# Patient Record
Sex: Male | Born: 1975 | Race: White | Hispanic: No | Marital: Single | Smoking: Current every day smoker
Health system: Southern US, Community
[De-identification: ages and names within clinical notes are randomized; demographics above are authoritative.]

## PROBLEM LIST (undated history)

## (undated) DIAGNOSIS — M549 Dorsalgia, unspecified: Secondary | ICD-10-CM

## (undated) DIAGNOSIS — M5430 Sciatica, unspecified side: Secondary | ICD-10-CM

## (undated) DIAGNOSIS — R51 Headache: Secondary | ICD-10-CM

## (undated) DIAGNOSIS — M24419 Recurrent dislocation, unspecified shoulder: Secondary | ICD-10-CM

## (undated) DIAGNOSIS — F1910 Other psychoactive substance abuse, uncomplicated: Secondary | ICD-10-CM

## (undated) DIAGNOSIS — R569 Unspecified convulsions: Secondary | ICD-10-CM

## (undated) DIAGNOSIS — M797 Fibromyalgia: Secondary | ICD-10-CM

## (undated) DIAGNOSIS — G8929 Other chronic pain: Secondary | ICD-10-CM

## (undated) HISTORY — PX: HAND SURGERY: SHX662

## (undated) HISTORY — PX: WRIST SURGERY: SHX841

---

## 1999-06-16 ENCOUNTER — Encounter: Payer: Self-pay | Admitting: Specialist

## 1999-06-16 ENCOUNTER — Encounter: Admission: RE | Admit: 1999-06-16 | Discharge: 1999-06-16 | Payer: Self-pay | Admitting: Specialist

## 2000-05-09 ENCOUNTER — Encounter: Payer: Self-pay | Admitting: Physical Medicine & Rehabilitation

## 2000-05-09 ENCOUNTER — Ambulatory Visit (HOSPITAL_COMMUNITY)
Admission: RE | Admit: 2000-05-09 | Discharge: 2000-05-09 | Payer: Self-pay | Admitting: Physical Medicine & Rehabilitation

## 2001-11-16 ENCOUNTER — Emergency Department (HOSPITAL_COMMUNITY): Admission: EM | Admit: 2001-11-16 | Discharge: 2001-11-16 | Payer: Self-pay | Admitting: Internal Medicine

## 2002-07-19 ENCOUNTER — Encounter
Admission: RE | Admit: 2002-07-19 | Discharge: 2002-10-17 | Payer: Self-pay | Admitting: Physical Medicine & Rehabilitation

## 2005-04-01 ENCOUNTER — Emergency Department (HOSPITAL_COMMUNITY): Admission: EM | Admit: 2005-04-01 | Discharge: 2005-04-01 | Payer: Self-pay | Admitting: Emergency Medicine

## 2005-04-18 ENCOUNTER — Emergency Department (HOSPITAL_COMMUNITY): Admission: EM | Admit: 2005-04-18 | Discharge: 2005-04-18 | Payer: Self-pay | Admitting: Emergency Medicine

## 2005-05-20 ENCOUNTER — Encounter: Admission: RE | Admit: 2005-05-20 | Discharge: 2005-05-20 | Payer: Self-pay | Admitting: Neurology

## 2005-06-04 ENCOUNTER — Emergency Department (HOSPITAL_COMMUNITY): Admission: EM | Admit: 2005-06-04 | Discharge: 2005-06-04 | Payer: Self-pay | Admitting: Emergency Medicine

## 2005-07-05 ENCOUNTER — Emergency Department (HOSPITAL_COMMUNITY): Admission: EM | Admit: 2005-07-05 | Discharge: 2005-07-05 | Payer: Self-pay | Admitting: Emergency Medicine

## 2005-07-10 ENCOUNTER — Emergency Department (HOSPITAL_COMMUNITY): Admission: EM | Admit: 2005-07-10 | Discharge: 2005-07-10 | Payer: Self-pay | Admitting: Emergency Medicine

## 2006-01-27 ENCOUNTER — Emergency Department (HOSPITAL_COMMUNITY): Admission: EM | Admit: 2006-01-27 | Discharge: 2006-01-27 | Payer: Self-pay | Admitting: Emergency Medicine

## 2007-01-06 IMAGING — CR DG SHOULDER 2+V PORT*R*
2 series · 2 of 2 positions shown · non-contrast
Comparison: none

CLINICAL DATA: Anterior dislocation of the right shoulder.
 RIGHT SHOULDER ? PORTABLE ? 2 VIEWS ? 07/05/05:

[view not recorded (1 of 2)]
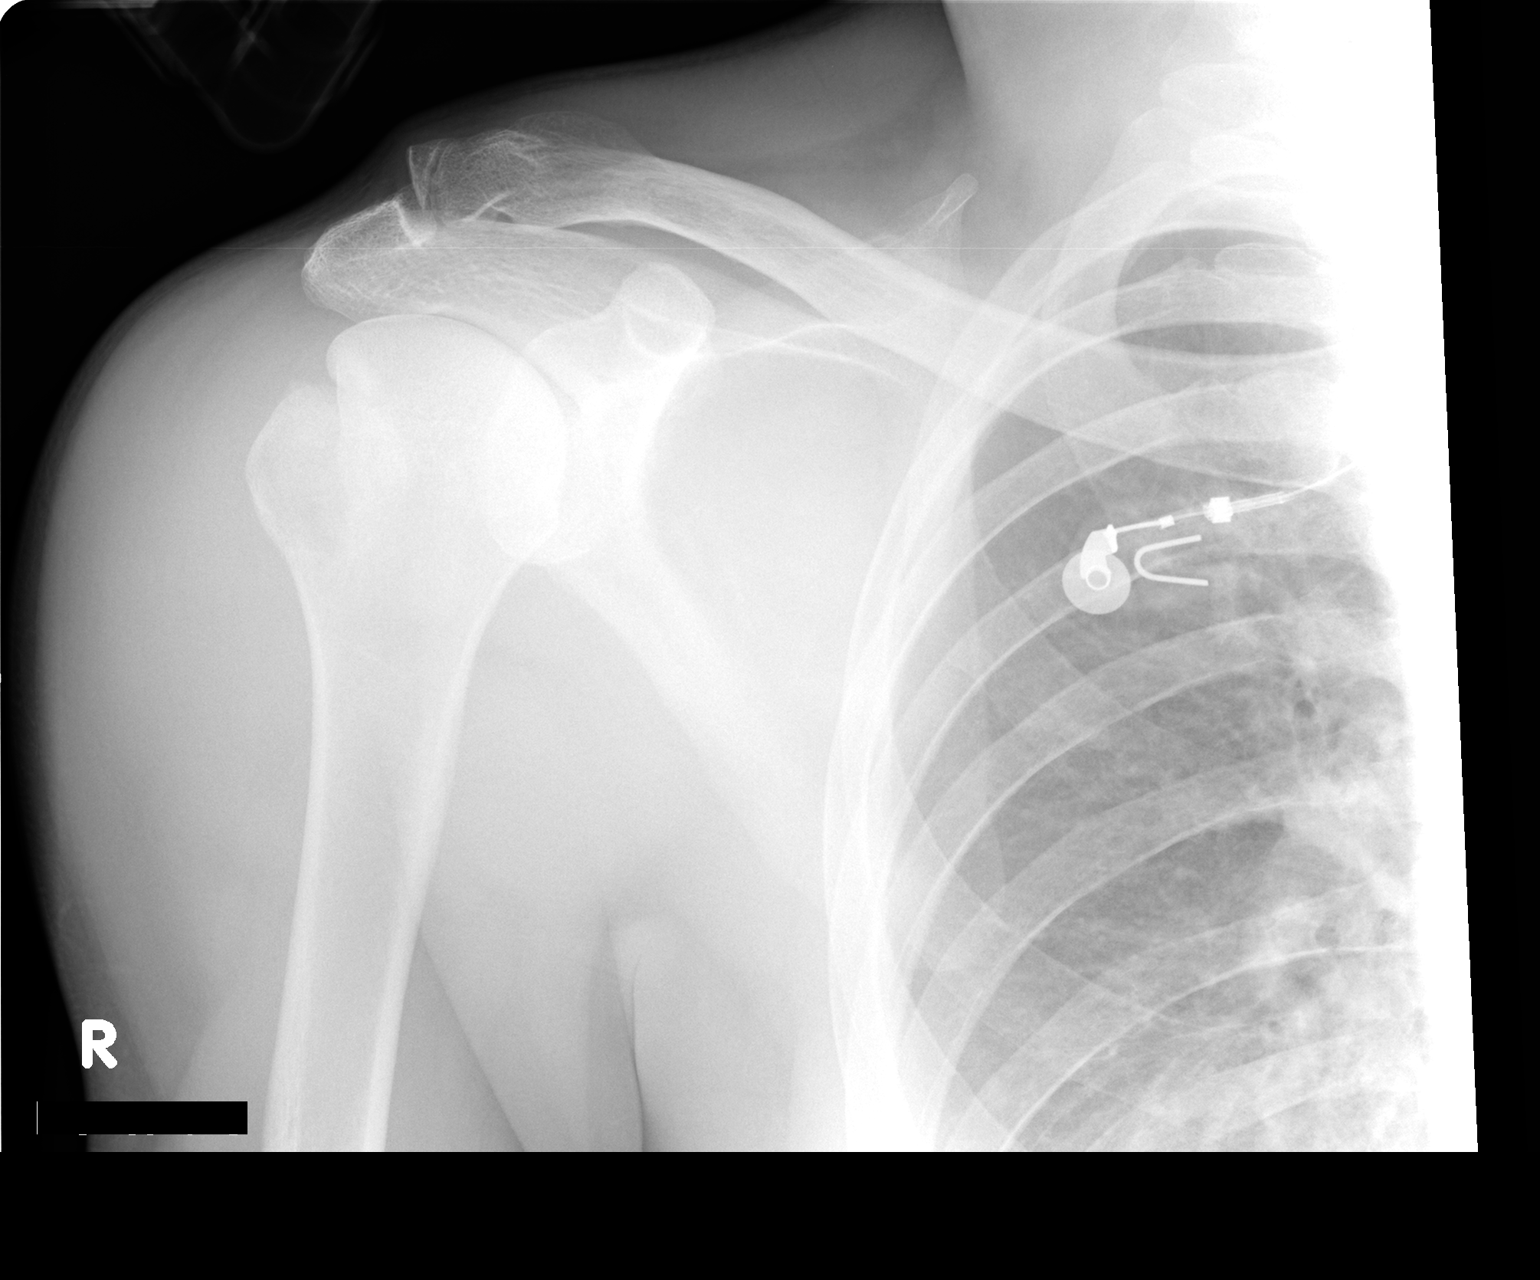

[view not recorded (2 of 2)]
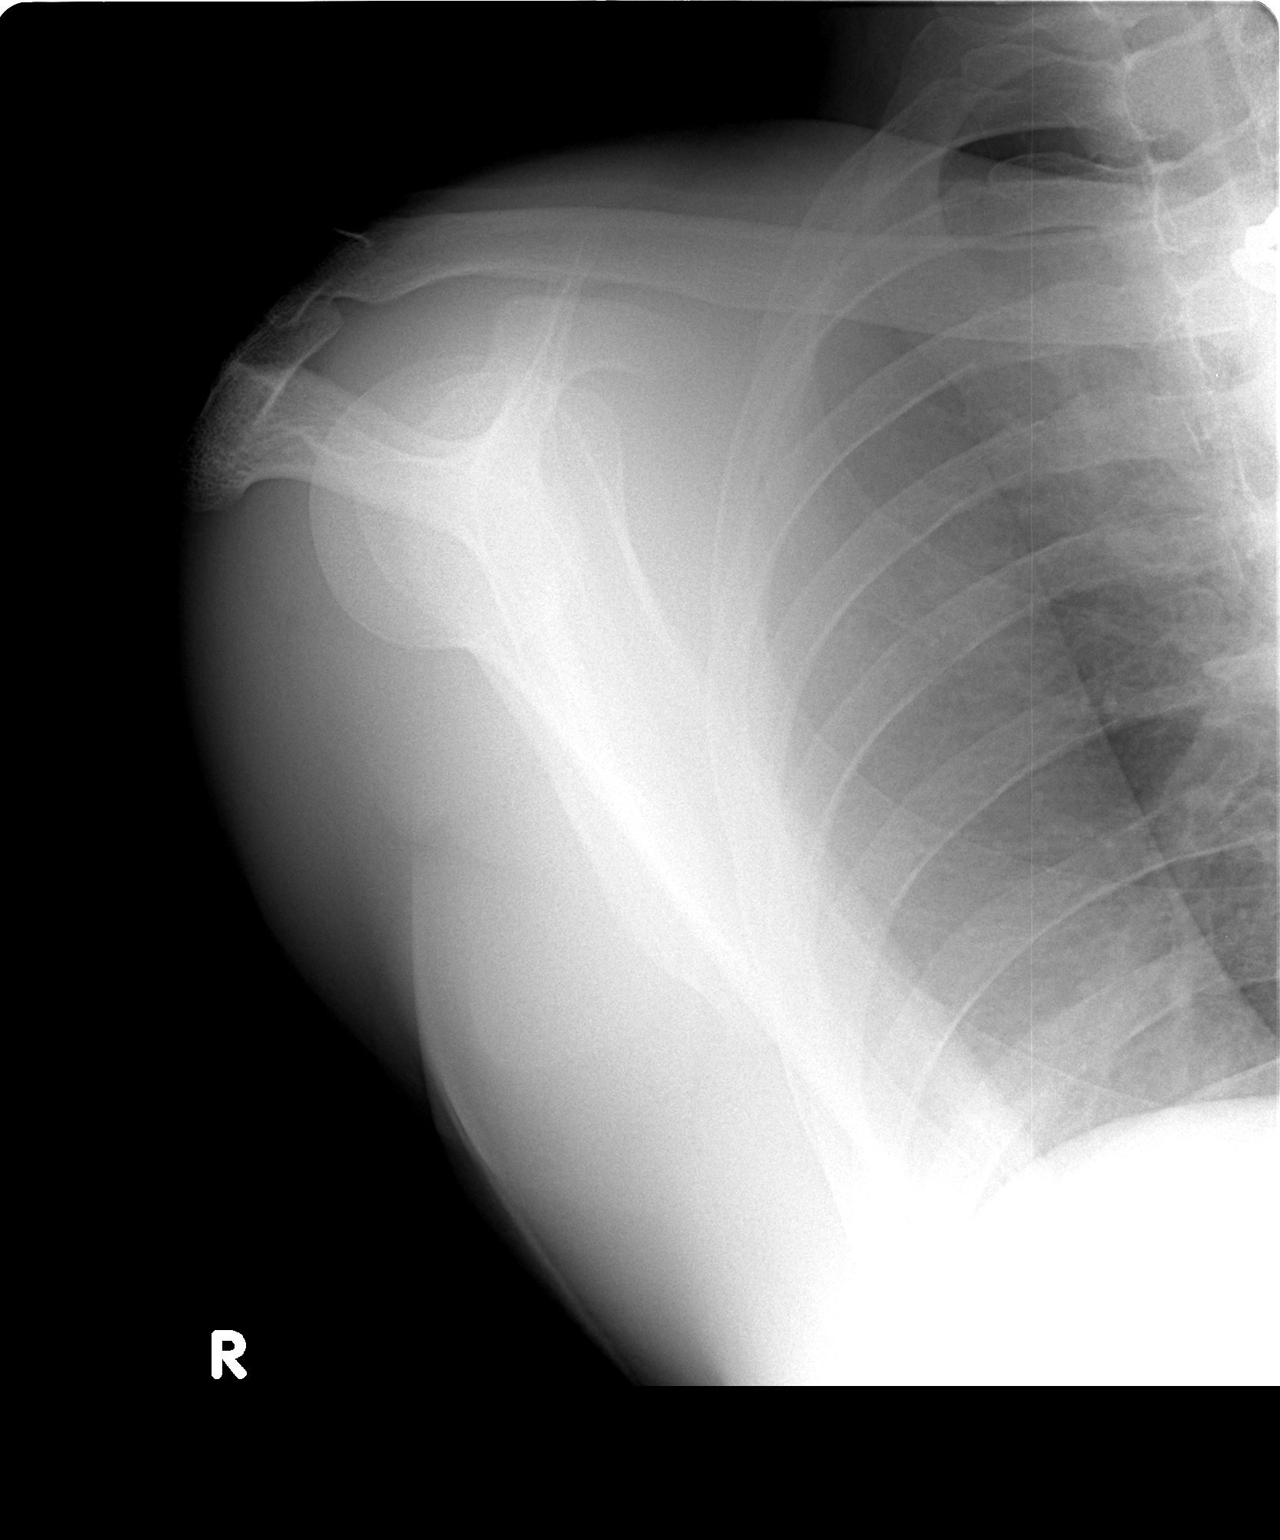

[2 of 2 positions shown; findings below may reference images not displayed]

FINDINGS: The anterior dislocation has been reduced.  The patient has a very prominent Hill-Sachs fracture of the posterolateral aspect of the humeral head.  There is evidence of an old fracture of the distal clavicle.  The transscapular view demonstrates that the dislocation has been reduced.
IMPRESSION: Reduction of the anterior dislocation.  Prominent Hill-Sachs fracture of the humeral head.

## 2007-01-11 IMAGING — CR DG SHOULDER 2+V PORT*R*
2 series · 2 of 2 positions shown · non-contrast
Comparison: none

CLINICAL DATA: Anterior fracture dislocation right shoulder.
 PORTABLE RIGHT SHOULDER ? 2 VIEW:

[view not recorded (1 of 2)]
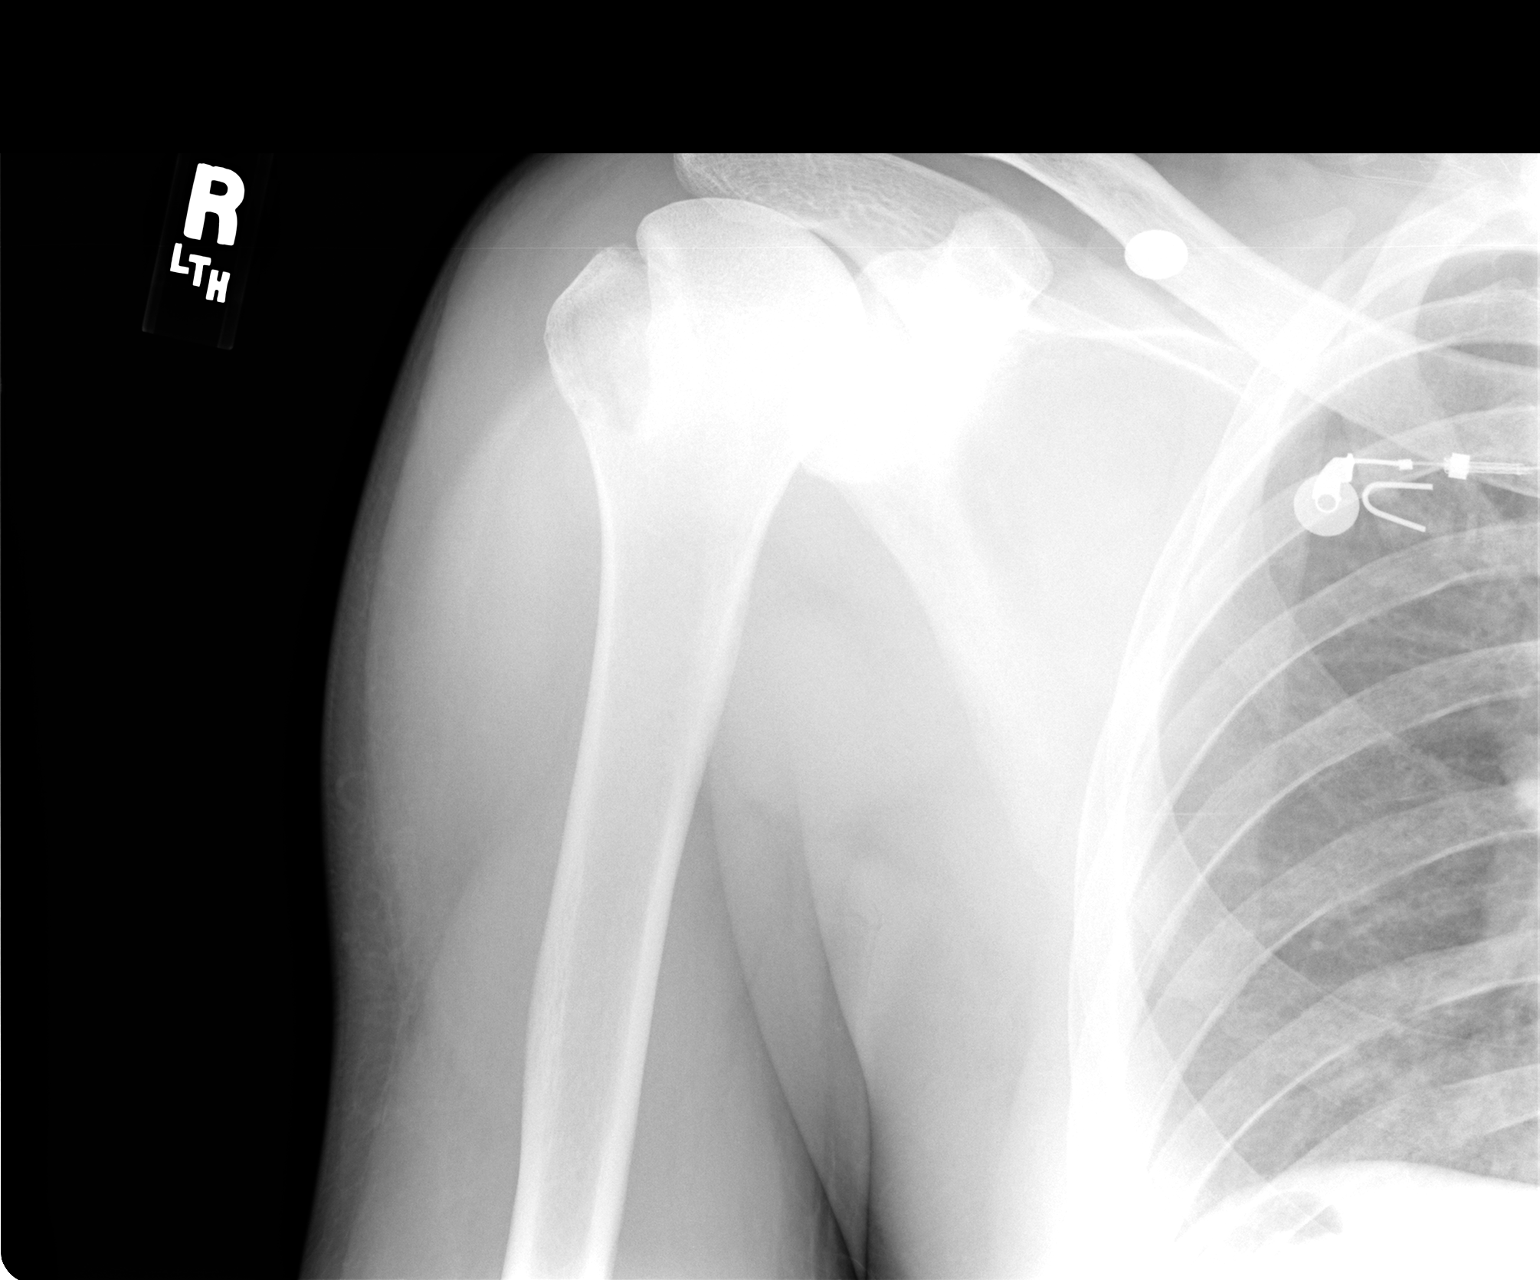

[view not recorded (2 of 2)]
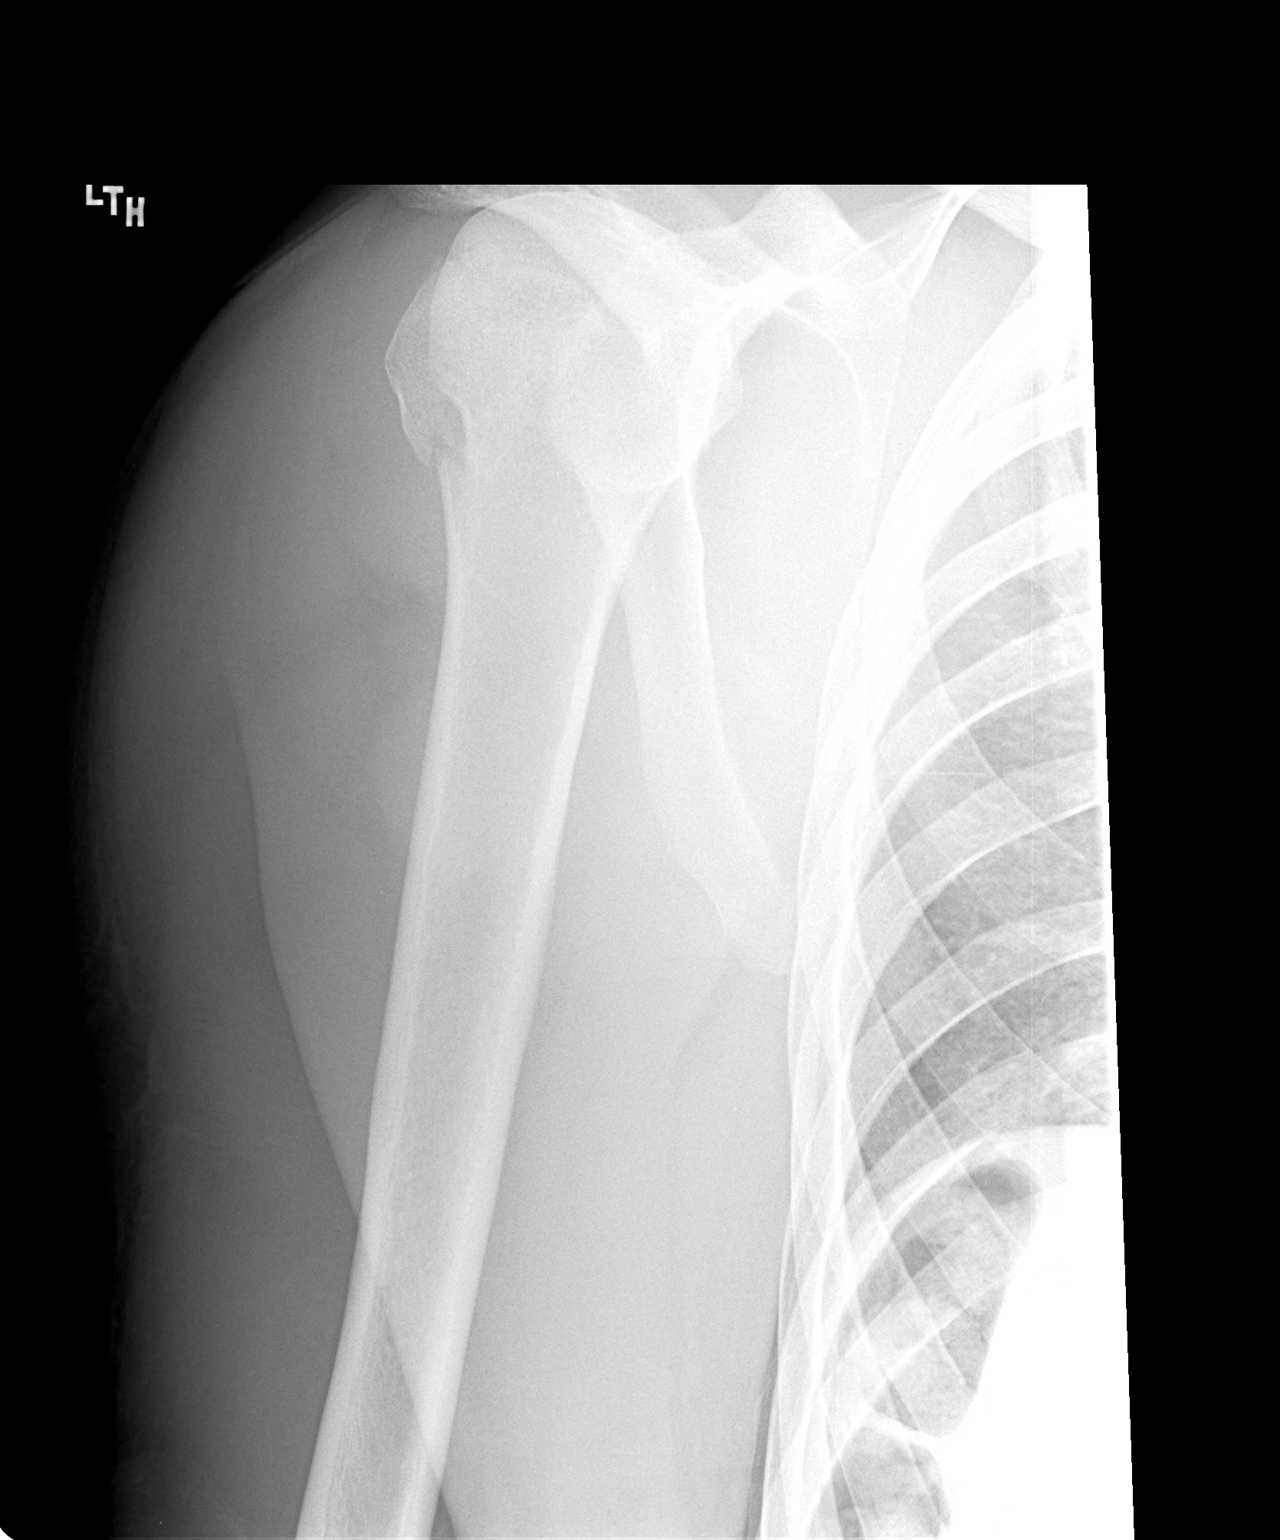

[2 of 2 positions shown; findings below may reference images not displayed]

FINDINGS: the anterior dislocation of the right shoulder has been reduced.  The humeral head is now in satisfactory position.  The fracture of the greater tuberosity and humerus is near anatomic aligned and positioned.
IMPRESSION: Satisfactory reduction anterior dislocation right shoulder as discussed above.

## 2007-02-15 ENCOUNTER — Emergency Department (HOSPITAL_COMMUNITY): Admission: EM | Admit: 2007-02-15 | Discharge: 2007-02-16 | Payer: Self-pay | Admitting: Emergency Medicine

## 2008-09-30 ENCOUNTER — Observation Stay (HOSPITAL_COMMUNITY): Admission: EM | Admit: 2008-09-30 | Discharge: 2008-09-30 | Payer: Self-pay | Admitting: Emergency Medicine

## 2009-01-21 ENCOUNTER — Emergency Department (HOSPITAL_COMMUNITY): Admission: EM | Admit: 2009-01-21 | Discharge: 2009-01-21 | Payer: Self-pay | Admitting: Emergency Medicine

## 2010-05-04 ENCOUNTER — Encounter: Payer: Self-pay | Admitting: Orthopaedic Surgery

## 2010-06-13 ENCOUNTER — Emergency Department (HOSPITAL_COMMUNITY)
Admission: EM | Admit: 2010-06-13 | Discharge: 2010-06-13 | Disposition: A | Discharge status: Home / Self Care | Payer: Self-pay | Attending: Emergency Medicine | Admitting: Emergency Medicine

## 2010-06-13 DIAGNOSIS — M549 Dorsalgia, unspecified: Secondary | ICD-10-CM | POA: Insufficient documentation

## 2010-06-13 LAB — COMPREHENSIVE METABOLIC PANEL
ALT: 28 U/L (ref 0–53)
Alkaline Phosphatase: 72 U/L (ref 39–117)
BUN: 13 mg/dL (ref 6–23)
CO2: 28 mEq/L (ref 19–32)
Chloride: 108 mEq/L (ref 96–112)

## 2010-06-13 LAB — CBC
HCT: 43.3 % (ref 39.0–52.0)
Platelets: 276 10*3/uL (ref 150–400)
RDW: 13.3 % (ref 11.5–15.5)
WBC: 10.4 10*3/uL (ref 4.0–10.5)

## 2010-06-13 LAB — DIFFERENTIAL
Basophils Relative: 0 % (ref 0–1)
Eosinophils Relative: 2 % (ref 0–5)
Lymphocytes Relative: 39 % (ref 12–46)
Lymphs Abs: 4.1 10*3/uL — ABNORMAL HIGH (ref 0.7–4.0)
Monocytes Relative: 5 % (ref 3–12)
Neutro Abs: 5.5 10*3/uL (ref 1.7–7.7)

## 2010-07-17 LAB — CARBAMAZEPINE LEVEL, TOTAL: Carbamazepine Lvl: 8.9 ug/mL (ref 4.0–12.0)

## 2010-07-21 LAB — DIFFERENTIAL
Basophils Absolute: 0 10*3/uL (ref 0.0–0.1)
Eosinophils Absolute: 0 10*3/uL (ref 0.0–0.7)
Eosinophils Relative: 0 % (ref 0–5)
Eosinophils Relative: 1 % (ref 0–5)
Lymphs Abs: 1.6 10*3/uL (ref 0.7–4.0)
Lymphs Abs: 2.3 10*3/uL (ref 0.7–4.0)
Monocytes Relative: 6 % (ref 3–12)
Neutrophils Relative %: 79 % — ABNORMAL HIGH (ref 43–77)

## 2010-07-21 LAB — COMPREHENSIVE METABOLIC PANEL
ALT: 27 U/L (ref 0–53)
AST: 25 U/L (ref 0–37)
CO2: 25 mEq/L (ref 19–32)
Calcium: 8.5 mg/dL (ref 8.4–10.5)
Chloride: 109 mEq/L (ref 96–112)
GFR calc Af Amer: >60 mL/min (ref >60)
GFR calc non Af Amer: >60 mL/min (ref >60)
Potassium: 4.4 mEq/L (ref 3.5–5.1)
Sodium: 138 mEq/L (ref 135–145)
Total Bilirubin: 0.7 mg/dL (ref 0.3–1.2)

## 2010-07-21 LAB — URINALYSIS, ROUTINE W REFLEX MICROSCOPIC
Bilirubin Urine: NEGATIVE
Ketones, ur: NEGATIVE mg/dL
Nitrite: NEGATIVE
Protein, ur: NEGATIVE mg/dL
Specific Gravity, Urine: <1.005 — ABNORMAL LOW (ref 1.005–1.030)

## 2010-07-21 LAB — BASIC METABOLIC PANEL
CO2: 27 mEq/L (ref 19–32)
Calcium: 8.9 mg/dL (ref 8.4–10.5)
Creatinine, Ser: 1.08 mg/dL (ref 0.4–1.5)

## 2010-07-21 LAB — CBC
MCHC: 35.6 g/dL (ref 30.0–36.0)
MCV: 88.2 fL (ref 78.0–100.0)
RBC: 4.38 MIL/uL (ref 4.22–5.81)
WBC: 11.9 10*3/uL — ABNORMAL HIGH (ref 4.0–10.5)
WBC: 16.4 10*3/uL — ABNORMAL HIGH (ref 4.0–10.5)

## 2010-07-21 LAB — RAPID URINE DRUG SCREEN, HOSP PERFORMED: Opiates: NOT DETECTED

## 2010-07-21 LAB — CK TOTAL AND CKMB (NOT AT ARMC): Relative Index: 0.7 (ref 0.0–2.5)

## 2010-07-21 LAB — TROPONIN I: Troponin I: 0.01 ng/mL (ref 0.00–0.06)

## 2010-08-26 NOTE — Discharge Summary (Signed)
Nathan Mckay, RISKO NO.:  [id number]   MEDICAL RECORD NO.:  [id number]          PATIENT TYPE:  INP   LOCATION:  IC04                          FACILITY:  [facility]   PHYSICIAN:  Theodosia Paling, MD    DATE OF BIRTH:  [birth year]   DATE OF ADMISSION:  09/29/2008  DATE OF DISCHARGE:  06/20/2010LH                               DISCHARGE SUMMARY   PRIMARY CARE PHYSICIAN:  Kofi A. Gerilyn Pilgrim, M.D.   ADMITTING HISTORY:  Please refer to the excellent admission note  dictated by Dr. Osvaldo Shipper under history of present illness.   DISCHARGE DIAGNOSIS:  Recreational drug overdose.   SECONDARY DIAGNOSES:  1. History of seizure disorder.  2. History of chronic back pain.   DISCHARGE MEDICATIONS:  The patient is to continue home medication.  No  new medication added.  Home medications include:  1. Tegretol 200 mg p.o. q.8 h.  2. Methadone, unknown dose.  The patient does not remember his dose of      methadone.   HOSPITAL COURSE:  Following issues were addressed during the  hospitalization:  1. Polysubstance abuse or drug overdose.  The patient had recreational      drug intake without any suicidal ideation.  The patient was      observed in the intensive care unit.  He did not develop any      cardiorespiratory distress.  At the time of discharge, the patient      is oriented x3 and hemodynamically stable.  The patient will      [facility] with [facility] for his narcotic dependence.  2. History of seizure disorder, did not have any further events.      Tegretol was continued.  3. Chronic back pain.  The patient is to continue with methadone.   DISPOSITION:  The patient will follow up with [facility] and Dr.  Gerilyn Pilgrim for his polysubstance abuse and seizure disorder respectively.   PROCEDURES PERFORMED:  None.   CONSULTATION PERFORMED:  None.   IMAGING PERFORMED:  CT of the head without contrast on September 29, 2008,  showing no acute intracranial  events.   Total time spent in discharge of this patient around 45 minutes.      Theodosia Paling, MD  Electronically Signed     NP/MEDQ  D:  09/30/2008  T:  10/01/2008  Job:  [id number]   cc:   Darleen Crocker A. Gerilyn Pilgrim, M.D.  Fax: [phone]

## 2010-08-26 NOTE — H&P (Signed)
NAME:  Nathan Mckay, HOAGLIN NO.:  [id number]   MEDICAL RECORD NO.:  [id number]          PATIENT TYPE:  EMS   LOCATION:  ED                            FACILITY:  [facility]   PHYSICIAN:  Osvaldo Shipper, MD     DATE OF BIRTH:  [birth year]   DATE OF ADMISSION:  09/29/2008  DATE OF DISCHARGE:  LH                              HISTORY & PHYSICAL   PRIMARY CARE PHYSICIAN:  The patient does not have a private medical  doctor, but he is followed by Dr. Gerilyn Pilgrim for a seizure disorder and  for his pain.   ADMISSION DIAGNOSES:  1. Drug overdose.  2. Polysubstance abuse, likely recreational.  3. History of seizure disorder.  4. History of chronic back pain.   CHIEF COMPLAINT:  Unresponsiveness.   HISTORY OF PRESENT ILLNESS:  The patient is a 35 year old Caucasian male  who was brought in by EMS for unresponsiveness.  The patient is  currently awake and alert, but he does not quite remember what happened.  Apparently he [place] his friends and used  recreational drugs.  He used marijuana, may have done cocaine, and he  said he took 3-4 pills of Xanax.  Apparently the patient's consciousness  started decreasing.  He became more and more unresponsive and so I  believe his friends called EMS.  The patient is currently in no  distress.  He denies any pain.  He really does not want to talk too much  at this time.  He denies any suicidal intent, homicidal intent.  He  denies any history of depression.  He denies any suicide attempt in the  past.   MEDICATIONS AT HOME:  Tegretol 200 mg t.i.d., methadone unknown dose and  unknown frequency.   ALLERGIES:  No known drug allergies.   PAST MEDICAL HISTORY:  Positive for seizure disorder for the past few  years.  History of chronic back pain.  He has had surgery to his wrist,  and he has had a shoulder dislocation which had to be fixed under  anesthesia.   SOCIAL HISTORY:  He lives in [place] with his parents.   Currently  unemployed.  He smokes 1 pack of cigarettes on a daily basis.  Occasional alcohol use. He denies drinking alcohol on a daily basis,  although he admits to drinking today.  He had a few beers.  He admitted  to illicit drug use only after I confronted him with the urinary drug  screen data.   FAMILY HISTORY:  Positive for cancer and otherwise he does not recall  what medical problems run in his family.   REVIEW OF SYSTEMS:  GENERAL:  Positive for weakness.  HEENT:  Unremarkable.  CARDIOVASCULAR:  Unremarkable.  RESPIRATORY:  Unremarkable.  GI: Unremarkable.  GU:  Unremarkable.  NEUROLOGICAL:  Unremarkable.  NEUROLOGIC:  Unremarkable.  DERMATOLOGIC:  Unremarkable.  All other systems are unremarkable.   PHYSICAL EXAMINATION:  VITAL SIGNS:  Temperature 98.8 rectally.  Blood  pressure 128/82, dropping to 94/54, improving to 108/50. Heart rate in  the 80's regular.  Respiratory rate 18.  Saturation 99-100% on room air.  GENERAL:  This is a well-developed, well-nourished, white male in no  distress.  HEENT:  There is no pallor, no icterus.  Oral:  Mucous membranes are  moist.  Pupils are equal, round, and reactive.  No oral lesions are  noted. Tobacco residue is noted.  No tongue bites are noted.  NECK:  Soft and supple.  No thyromegaly is appreciated.  LUNGS:  Clear to auscultation anterior bilaterally.  No wheezes, rales,  or rhonchi.  CARDIOVASCULAR:  S1 and S2, normal, regular.  No murmurs appreciated.  No S3 or S4.  No rubs.  No bruits.  No edema is noted.  Peripheral  pulses are palpable.  ABDOMEN: Soft.  There is some vague discomfort in the suprapubic area  but no rebound, rigidity, or guarding is present.  Bowel sounds are  present.  No masses or organomegaly is appreciated.  GU:  Examination unremarkable.  MUSCULOSKELETAL:  No abnormality detected.  SKIN:  He has a small cut in 1 of his fingers.  He has some abrasions  over his lower extremities.  Otherwise nothing  noted.  NEUROLOGICAL:  He is alert and oriented to place, year, month.  No focal  deficits are noted.   LABS:  White count 16,400 with 79% neutrophils, hemoglobin 14.8, MCV 88,  platelet count 177.  BMET is unremarkable.  Cardiac enzymes were  negative x1.  Acetaminophen, epinephrine and salicylate levels are  nontoxic.  Urinary drug screen positive for benzos, cocaine, and  marijuana. Alcohol level 60.  Urinalysis otherwise negative for  infection.   IMAGING STUDIES:  He had CT scan of the head which did not show any  acute process intracranially. An EKG was also done which shows sinus  rhythm at the rate of 82.  Axis is normal and intervals appeared to be  within normal range.  No Q waves are noted. No concerning  ST changes  are noted. Nonspecific T wave changes are appreciated.   ASSESSMENT:  This is a 35 year old Caucasian male who was brought into  the hospital for unresponsiveness.  He apparently has done quite a few  drugs.  I was told that he used a substance called JWH, which apparently  is a stronger version of marijuana. He also has done cocaine, and he  took some benzodiazepines as well.  He also has consumed some alcohol  today.   PLAN:  1. Drug overdose.  This was as recreational use.  This was not taken      with an intent to harm.  The patient will be monitored in the      intensive care unit overnight.  If he is stable in the morning he      should be able to go home.  2. Seizure disorder.  It is unlikely that the patient had a seizure.      There are no bite marks.  He does not have any acidosis.  I think      this is all related to his illicit drug use. Continue with      Tegretol.  3. Chronic back pain. He does not know the exact dose of methadone. We      will continue with 10 mg b.i.d. for now.  Hold for sedation.  4. Deep vein thrombosis prophylaxis will be initiated.  5. Alcohol use.  He tells me and he is very adamant that he does not      consume on a  daily basis. We will give some thiamine and Ativan      p.r.n.  I will not put him on CIWA protocol at this time.  6. Serum acetaminophen levels will be repeated. It is unclear exactly      when all this happened, so I will repeat at 4 hours from when he      was brought in to the hospital.  7. Leukocytosis.  Etiology is unclear.  He is afebrile.  No      antibiotics at this time.  We will just follow the levels.  8. He is a full code.  9. I am hoping, considering his rapid improvement in the ED, that he      will be stable overnight and he will be stable enough to be      discharged tomorrow after being evaluated by the Behavioral Health      team.      Osvaldo Shipper, MD  Electronically Signed     GK/MEDQ  D:  09/29/2008  T:  09/30/2008  Job:  [id number]   cc:   Darleen Crocker A. Gerilyn Pilgrim, M.D.  Fax: [phone]

## 2010-08-29 NOTE — Op Note (Signed)
NAMEKOLTON, KIENLE NO.:  [id number]   MEDICAL RECORD NO.:  [id number]          PATIENT TYPE:  EMS   LOCATION:  ED                            FACILITY:  [facility]   PHYSICIAN:  J. Darreld Mclean, M.D. DATE OF BIRTH:  1976/03/08   DATE OF PROCEDURE:  07/10/2005  DATE OF DISCHARGE:  07/10/2005                                 OPERATIVE REPORT   PREOPERATIVE DIAGNOSIS:  Recurrent dislocation of the right shoulder plus  new fracture of the radial tuberosity.   POSTOPERATIVE DIAGNOSIS:  Recurrent dislocation of the right shoulder plus  new fracture of the radial tuberosity.   OPERATION PERFORMED:  Closed reduction anesthesia, the right shoulder.   SURGEON:  J. Darreld Mclean, M.D.   ANESTHESIA:  General.   No wound, no drains.   INDICATIONS FOR PROCEDURE:  Patient a 35 year old male who is an admitted  drug addict.  He had dislocation of the shoulder this past Sunday.  Five  days ago, I did a reduction under anesthesia Sunday morning.  Saw me in the  office following day.  He was doing well.  Some time during the night he  does not know what happened.  He can not tell me what happened or how it  happened but his shoulder dislocated again.  He took a methadone and four  Percocet 7.5's after he did this.  Comes to emergency room complaining of  severe pain and difficulty.  X-ray showed dislocation anterior of the right  shoulder again plus a fracture of the greater tuberosity which was not  present and x-rays before including postreduction views.  He has significant  ecchymosis of his right arm that was not present the other day either,  either the day of the procedure or the following day in the office.  I am  concerned about his mental well being.  He has a history of being able to  sublux and dislocate this arm voluntarily.  He denied doing that today.  I  am also concerned that his abuse of the medication that he was given.  He  was given strong instructions for  taking it.  It was to last 10 days to two  weeks __________ finished his supply.  His mother is present with him and I  have talked to her.  This needs to be reduced.  This will need an MRI and  maybe surgery later to correct the fracture depending how well it reduces  and he may need surgery to help correct the shoulder from redislocating.  First must consider his drug abuse and psychiatric condition, mental well  being.  He has not had anything to eat.  I have asked the operating room to  accommodate him as quickly as possible and Dr. Jerre Simon has graciously agreed  to let me bump him this morning.   DESCRIPTION OF PROCEDURE:  The patient was seen in the holding area.  The  right shoulder was identified as correct surgical site.  I placed a mark, he  placed a mark.  The anesthesiologist also given a block for the upper  extremity.  He was taken to the operating room.  While supine on the  stretcher he was given general anesthetic.  He was intubated.  I then did a  closed reduction and shoulder was reduced without significant problem.  Post  reduction x-rays show reduction of the shoulder.  The greater tuberosity  fragment was aligned very nicely both in AP and lateral views.  Instructions  for care will be given to both the patient and his mother.  He is to keep  the shoulder  immobilizer on, follow the instructions I have given him previously.  I will  see him in the office again on Monday.  Any difficulties to let us know.  He  has pain medicine already he has already taken.  He needs to take Advil 2 to  3 tablets 3 or 4 times a day after eating.           ______________________________  Shela Commons. Darreld Mclean, M.D.     JWK/MEDQ  D:  07/10/2005  T:  07/13/2005  Job:  [id number]

## 2010-08-29 NOTE — Op Note (Signed)
NAME:  Nathan Mckay, Nathan Mckay NO.:  [id number]   MEDICAL RECORD NO.:  [id number]          PATIENT TYPE:  EMS   LOCATION:  ED                            FACILITY:  [facility]   PHYSICIAN:  J. Darreld Mclean, M.D. DATE OF BIRTH:  02-23-1976   DATE OF PROCEDURE:  DATE OF DISCHARGE:  07/05/2005                                 OPERATIVE REPORT   PREOPERATIVE DIAGNOSIS:  Anterior dislocation, right shoulder.   POSTOPERATIVE DIAGNOSIS:  Anterior dislocation, right shoulder.   PROCEDURE:  Closed reduction of the right shoulder dislocation under general  anesthesia.   ANESTHESIA:  General.   SURGEON:  J. Darreld Mclean, M.D.   DRAINS:  None.   INDICATION:  The patient is a 35 year old male who dislocated his shoulder  earlier this morning.  He presented to the ER.  The ER physician tried to  relocate it.  After 200 mg fentanyl IV and 30 mg Versed, she was able to  relocate the shoulder.  She called me.  The patient has a history of chronic  pain syndrome.  He is on methadone.  He also has a history that he can  spontaneously dislocate or sublux his shoulder and relocate it but he could  not relocate it today.  There is a question whether he had a seizure  earlier.  He has a history of seizure disorder.  He is accompanied by his  mother.  I recommended taking him to the operating room for closed reduction  as it was unable to do successfully without general anesthesia.  The risks  were explained.  He appeared to understand and agree with procedure as  outlined.   DESCRIPTION OF PROCEDURE:  The patient was seen and the right shoulder was  identified as the correct surgical area.  I placed a marker on the shoulder.  The patient was previously identified in the area.  He was kept on the ER  stretcher.  General anesthesia was obtained.  Once general anesthesia was  obtained, closed reduction was carried out.  The shoulder was relocated.  Post reduction x-rays looked good.  It should  also be noted that it looks  like he has an old healed fracture of the distal clavicle that was not  readily apparent on the original x-ray due to the rotation.  No apparent  fracture is present of the shoulder post dislocation.  He had an AP and a  Y view.  He will be awakened from anesthesia and given a prescription for  Percocet 7.5/325.  He will see me in the office tomorrow morning.  If any  difficulty, come back to the emergency room.  He is to continue wearing the  shoulder immobilizer at all times.  I explained this to his mother.  Again,  if any difficulty, return to the emergency room.          ______________________________  Shela Commons. Darreld Mclean, M.D.    JWK/MEDQ  D:  07/05/2005  T:  07/08/2005  Job:  [id number]

## 2011-06-07 ENCOUNTER — Encounter (HOSPITAL_COMMUNITY): Payer: Self-pay | Admitting: Emergency Medicine

## 2011-06-07 ENCOUNTER — Emergency Department (HOSPITAL_COMMUNITY): Payer: Self-pay

## 2011-06-07 ENCOUNTER — Emergency Department (HOSPITAL_COMMUNITY)
Admission: EM | Admit: 2011-06-07 | Discharge: 2011-06-07 | Disposition: A | Discharge status: Home / Self Care | Payer: Self-pay | Attending: Emergency Medicine | Admitting: Emergency Medicine

## 2011-06-07 DIAGNOSIS — R109 Unspecified abdominal pain: Secondary | ICD-10-CM

## 2011-06-07 DIAGNOSIS — R110 Nausea: Secondary | ICD-10-CM | POA: Insufficient documentation

## 2011-06-07 DIAGNOSIS — R1032 Left lower quadrant pain: Secondary | ICD-10-CM | POA: Insufficient documentation

## 2011-06-07 DIAGNOSIS — R319 Hematuria, unspecified: Secondary | ICD-10-CM | POA: Insufficient documentation

## 2011-06-07 DIAGNOSIS — F17200 Nicotine dependence, unspecified, uncomplicated: Secondary | ICD-10-CM | POA: Insufficient documentation

## 2011-06-07 DIAGNOSIS — N390 Urinary tract infection, site not specified: Secondary | ICD-10-CM | POA: Insufficient documentation

## 2011-06-07 LAB — URINALYSIS, ROUTINE W REFLEX MICROSCOPIC
Glucose, UA: NEGATIVE mg/dL
Ketones, ur: NEGATIVE mg/dL
Leukocytes, UA: NEGATIVE
Protein, ur: 30 mg/dL — AB

## 2011-06-07 MED ORDER — HYDROCODONE-ACETAMINOPHEN 7.5-500 MG/15ML PO SOLN: 15.0000 mL | Freq: Four times a day (QID) | ORAL | Status: AC | PRN

## 2011-06-07 MED ORDER — IBUPROFEN 800 MG PO TABS: 800.0000 mg | ORAL_TABLET | Freq: Three times a day (TID) | ORAL | Status: AC

## 2011-06-07 MED ORDER — IBUPROFEN 800 MG PO TABS: 800.0000 mg | ORAL_TABLET | Freq: Once | ORAL | Status: DC

## 2011-06-07 MED ORDER — PROMETHAZINE HCL 25 MG PO TABS: 25.0000 mg | ORAL_TABLET | Freq: Four times a day (QID) | ORAL | Status: AC | PRN

## 2011-06-07 MED ORDER — CIPROFLOXACIN HCL 500 MG PO TABS: 500.0000 mg | ORAL_TABLET | Freq: Two times a day (BID) | ORAL | Status: AC

## 2011-06-07 MED ORDER — ONDANSETRON HCL 4 MG/2ML IJ SOLN
4.0000 mg | Freq: Once | INTRAMUSCULAR | Status: AC
  Administered 2011-06-07: 4 mg via INTRAVENOUS
  Filled 2011-06-07: qty 2

## 2011-06-07 MED ORDER — HYDROMORPHONE HCL PF 1 MG/ML IJ SOLN
1.0000 mg | Freq: Once | INTRAMUSCULAR | Status: AC
  Administered 2011-06-07: 1 mg via INTRAVENOUS
  Filled 2011-06-07: qty 1

## 2011-06-07 MED ORDER — DEXTROSE 5 % IV SOLN
1.0000 g | Freq: Once | INTRAVENOUS | Status: AC
  Administered 2011-06-07: 1 g via INTRAVENOUS
  Filled 2011-06-07: qty 10

## 2011-06-07 MED ORDER — SODIUM CHLORIDE 0.9 % IV BOLUS (SEPSIS)
1000.0000 mL | Freq: Once | INTRAVENOUS | Status: AC
  Administered 2011-06-07: 1000 mL via INTRAVENOUS

## 2011-06-07 NOTE — ED Provider Notes (Signed)
History     CSN: [id number]  Arrival date & time 06/07/11  0546   First MD Initiated Contact with Patient 06/07/11 [phone]      Chief Complaint  Patient presents with  . Abdominal Pain  . Flank Pain    (Consider location/radiation/quality/duration/timing/severity/associated sxs/prior treatment) The history is provided by the patient.  location left flank. Radiation from back left lower quadrant abdomen. Quality is sharp. Duration constant since onset 2 days ago. Severe pain 10 out of 10. Has associated hematuria with nausea and no vomiting. No diarrhea. No history of kidney stones or similar symptoms. No urinary urgency or frequency or dysuria. No trauma. No abdominal pain otherwise. No history of surgeries. Better if he puts pressure on that area the pain comes back.  No known aggravating factors  History reviewed. No pertinent past medical history.  History reviewed. No pertinent past surgical history.  History reviewed. No pertinent family history.  History  Substance Use Topics  . Smoking status: Current Everyday Smoker    Types: Cigarettes  . Smokeless tobacco: Not on file  . Alcohol Use: No      Review of Systems  Constitutional: Negative for fever and chills.  HENT: Negative for neck pain and neck stiffness.   Eyes: Negative for pain.  Respiratory: Negative for shortness of breath.   Cardiovascular: Negative for chest pain.  Gastrointestinal: Positive for nausea. Negative for abdominal pain and constipation.  Genitourinary: Positive for hematuria and flank pain. Negative for dysuria.  Musculoskeletal: Negative for back pain.  Skin: Negative for rash.  Neurological: Negative for headaches.  All other systems reviewed and are negative.    Allergies  Nickel  Home Medications  No current outpatient prescriptions on file.  BP 107/70  Pulse 85  Temp(Src) 98.2 F (36.8 C) (Oral)  Resp 18  Ht 5\' 9"  (1.753 m)  Wt 179 lb (81.194 kg)  BMI 26.43 kg/m2  SpO2  97%  Physical Exam  Constitutional: He is oriented to person, place, and time. He appears well-developed and well-nourished.  HENT:  Head: Normocephalic and atraumatic.  Eyes: Conjunctivae and EOM are normal. Pupils are equal, round, and reactive to light.  Neck: Trachea normal. Neck supple. No thyromegaly present.  Cardiovascular: Normal rate, regular rhythm, S1 normal, S2 normal and normal pulses.     No systolic murmur is present   No diastolic murmur is present  Pulses:      Radial pulses are 2+ on the right side, and 2+ on the left side.  Pulmonary/Chest: Effort normal and breath sounds normal. He has no wheezes. He has no rhonchi. He has no rales. He exhibits no tenderness.  Abdominal: Soft. Normal appearance and bowel sounds are normal. There is no tenderness. There is no CVA tenderness and negative Murphy's sign.       Localizes discomfort left flank without reproducible tenderness.  Musculoskeletal:       BLE:s Calves nontender, no cords or erythema, negative Homans sign  Neurological: He is alert and oriented to person, place, and time. He has normal strength. No cranial nerve deficit or sensory deficit. GCS eye subscore is 4. GCS verbal subscore is 5. GCS motor subscore is 6.  Skin: Skin is warm and dry. No rash noted. He is not diaphoretic.  Psychiatric: His speech is normal.       Cooperative and appropriate    ED Course  Procedures (including critical care time)  Labs Reviewed  URINALYSIS, ROUTINE W REFLEX MICROSCOPIC - Abnormal; Notable for  the following:    Color, Urine BROWN (*) BIOCHEMICALS MAY BE AFFECTED BY COLOR   APPearance CLOUDY (*)    Specific Gravity, Urine >1.030 (*)    Hgb urine dipstick LARGE (*)    Bilirubin Urine SMALL (*)    Protein, ur 30 (*)    All other components within normal limits  URINE MICROSCOPIC-ADD ON - Abnormal; Notable for the following:    Bacteria, UA MANY (*)    All other components within normal limits   UA reviewed has  hematuria and bacteria.  CT scan reviewed no obvious stone.pain improved in the ED. IV antibiotics and urine culture   MDM   Flank pain and hematuria clinical urolithiasis not seen on CAT scan. Pain medications and urology referral provided. urine culture pending.        Sunnie Nielsen, MD 06/07/11 [phone]

## 2011-06-07 NOTE — ED Notes (Signed)
Patient with no complaints at this time. Respirations even and unlabored. Skin warm/dry. Discharge instructions reviewed with patient at this time. Patient given opportunity to voice concerns/ask questions. IV removed per policy and band-aid applied to site. Patient discharged at this time and left Emergency Department with steady gait.  

## 2011-06-07 NOTE — Discharge Instructions (Signed)
Flank Pain Flank pain refers to pain that is located on the side of the body between the upper abdomen and the back. It can be caused by many things. CAUSES  Some of the more common causes of flank pain include:  Muscle strain.   Muscle spasms.   A disease of your spine (vertebral disk disease).   A lung infection (pneumonia).   Fluid around your lungs (pulmonary edema).   A kidney infection.   Kidney stones.   A very painful skin rash on only one side of your body (shingles).   Gallbladder disease.  DIAGNOSIS  Blood tests, urine tests, and X-rays may help your caregiver determine what is wrong. TREATMENT  The treatment of pain depends on the cause. Your caregiver will determine what treatment will work best for you. HOME CARE INSTRUCTIONS   Home care will depend on the cause of your pain.   Some medications may help relieve the pain. Take medication for relief of pain as directed by your caregiver.   Tell your caregiver about any changes in your pain.   Follow up with your caregiver.  SEEK IMMEDIATE MEDICAL CARE IF:   Your pain is not controlled with medication.   The pain increases.   You have abdominal pain.   You have shortness of breath.   You have persistent nausea or vomiting.   You have swelling in your abdomen.   You feel faint or pass out.   You have a temperature by mouth above 102 F (38.9 C), not controlled by medicine.  MAKE SURE YOU:   Understand these instructions.   Will watch your condition.   Will get help right away if you are not doing well or get worse.  Document Released: 05/21/2005 Document Revised: 12/10/2010 Document Reviewed: 09/14/2009 [employer] Patient Information 2012 [employer]. 

## 2011-06-07 NOTE — ED Notes (Signed)
Patient states that he started having left lower abdominal pain that moves into back. States pain goes away if he pushes it, but comes back. Reports he has had pain for several days, but the pain has gotten worse. Patient complains of nausea, denies vomiting, diarrhea, or burning on urination.

## 2011-06-08 LAB — URINE CULTURE
Colony Count: 2000
Culture  Setup Time: 201302242118

## 2011-11-23 ENCOUNTER — Emergency Department (HOSPITAL_COMMUNITY)
Admission: EM | Admit: 2011-11-23 | Discharge: 2011-11-23 | Disposition: A | Discharge status: Home / Self Care | Payer: Self-pay | Attending: Emergency Medicine | Admitting: Emergency Medicine

## 2011-11-23 ENCOUNTER — Encounter (HOSPITAL_COMMUNITY): Payer: Self-pay | Admitting: Neurology

## 2011-11-23 DIAGNOSIS — X58XXXA Exposure to other specified factors, initial encounter: Secondary | ICD-10-CM | POA: Insufficient documentation

## 2011-11-23 DIAGNOSIS — G40909 Epilepsy, unspecified, not intractable, without status epilepticus: Secondary | ICD-10-CM | POA: Insufficient documentation

## 2011-11-23 DIAGNOSIS — S0003XA Contusion of scalp, initial encounter: Secondary | ICD-10-CM | POA: Insufficient documentation

## 2011-11-23 DIAGNOSIS — S01501A Unspecified open wound of lip, initial encounter: Secondary | ICD-10-CM | POA: Insufficient documentation

## 2011-11-23 DIAGNOSIS — Z91199 Patient's noncompliance with other medical treatment and regimen due to unspecified reason: Secondary | ICD-10-CM | POA: Insufficient documentation

## 2011-11-23 DIAGNOSIS — Z9119 Patient's noncompliance with other medical treatment and regimen: Secondary | ICD-10-CM | POA: Insufficient documentation

## 2011-11-23 HISTORY — DX: Unspecified convulsions: R56.9

## 2011-11-23 MED ORDER — CARBAMAZEPINE 200 MG PO TABS: 200.0000 mg | ORAL_TABLET | Freq: Three times a day (TID) | ORAL | Status: DC

## 2011-11-23 MED ORDER — CARBAMAZEPINE 200 MG PO TABS
200.0000 mg | ORAL_TABLET | Freq: Once | ORAL | Status: AC
  Administered 2011-11-23: 200 mg via ORAL
  Filled 2011-11-23: qty 1

## 2011-11-23 MED ORDER — TETANUS-DIPHTH-ACELL PERTUSSIS 5-2.5-18.5 LF-MCG/0.5 IM SUSP
0.5000 mL | Freq: Once | INTRAMUSCULAR | Status: DC
  Filled 2011-11-23: qty 0.5

## 2011-11-23 NOTE — ED Notes (Signed)
Per ems- Pt was working had unwitnessed seizure. Laceration to inner lip. Pt has hx of seizures, hasn't been taking meds x 1 month. C/o pain to back of head and bilateral calf pain. No neck or back pain. No obvious injury. Skin is diaphoretic. Pt alert, responding appropriately. CBG 160. BP 109/64, HR 95 SR, 97% RA. 20 R. AC

## 2011-11-23 NOTE — ED Provider Notes (Signed)
History     CSN: [id number]  Arrival date & time 11/23/11  1239   First MD Initiated Contact with Patient 11/23/11 1301      Chief Complaint  Patient presents with  . Seizures    (Consider location/radiation/quality/duration/timing/severity/associated sxs/prior treatment) HPI Patient suffers seizure today while at work. Suffered laceration to the mucosal surface of lower lip as result fall also complains of mild facial pain no other complaint admits to noncompliance with Tegretol for greater than one month. He denies headache denies calf pain no other complaint. No treatment prior to coming here Past Medical History  Diagnosis Date  . Seizures     Past Surgical History  Procedure Date  . Hand surgery     No family history on file.  History  Substance Use Topics  . Smoking status: Current Everyday Smoker    Types: Cigarettes  . Smokeless tobacco: Not on file  . Alcohol Use: No   no drug use for several years    Review of Systems  Constitutional: Negative.   HENT: Negative.   Respiratory: Negative.   Cardiovascular: Negative.   Gastrointestinal: Negative.   Musculoskeletal: Negative.   Skin: Positive for wound.  Neurological: Positive for seizures.  Hematological: Negative.   Psychiatric/Behavioral: Negative.   All other systems reviewed and are negative.    Allergies  Nickel  Home Medications  No current outpatient prescriptions on file.  BP 102/70  Pulse 72  Temp 98.3 F (36.8 C) (Oral)  Resp 20  SpO2 99%  Physical Exam  Nursing note and vitals reviewed. Constitutional: He is oriented to person, place, and time. He appears well-developed and well-nourished.  HENT:  Right Ear: External ear normal.  Left Ear: External ear normal.       Dried blood at both nares, minimal tenderness at bridge of nose. 1 cm laceration mucosal surface of lower lip otherwise normocephalic atraumatic  Eyes: Conjunctivae are normal. Pupils are equal, round, and  reactive to light.  Neck: Neck supple. No tracheal deviation present. No thyromegaly present.  Cardiovascular: Normal rate and regular rhythm.   No murmur heard. Pulmonary/Chest: Effort normal and breath sounds normal.  Abdominal: Soft. Bowel sounds are normal. He exhibits no distension. There is no tenderness.  Musculoskeletal: Normal range of motion. He exhibits no edema and no tenderness.  Neurological: He is alert and oriented to person, place, and time. Coordination normal.       Gait normal  Skin: Skin is warm and dry. No rash noted.  Psychiatric: He has a normal mood and affect.    ED Course  Procedures (including critical care time)  Labs Reviewed - No data to display No results found.   No diagnosis found.    MDM  Spoke with hospital pharmacist unable to load with Tegretol. Plan prescription Tegretol 200 mg 3 times daily Saltwater rinses for  Mouth. Lip laceration does not require repair Tetanus booster Followup Dr.[place] Diagnosis #1 seizure #2 medication noncompliance #3 lip laceration #4 facial contusion        Doug Sou, MD 11/23/11 1330

## 2011-11-23 NOTE — ED Notes (Signed)
Pt has puncture wound to lower lip bleeding controlled. Pt has bleeding from nose. Dried blood present. Swelling to nose noted

## 2011-11-23 NOTE — ED Notes (Signed)
Walked into room to check on patient. Pt had removed c-collar and was sitting upright in bed talking on phone. c-collar reapplied and patient asked to lie down in bed

## 2011-11-24 ENCOUNTER — Emergency Department (HOSPITAL_COMMUNITY)
Admission: EM | Admit: 2011-11-24 | Discharge: 2011-11-24 | Discharge status: Against Medical Advice | Payer: Self-pay | Attending: Emergency Medicine | Admitting: Emergency Medicine

## 2011-11-24 ENCOUNTER — Encounter (HOSPITAL_COMMUNITY): Payer: Self-pay

## 2011-11-24 DIAGNOSIS — Z5321 Procedure and treatment not carried out due to patient leaving prior to being seen by health care provider: Secondary | ICD-10-CM

## 2011-11-24 DIAGNOSIS — Z0389 Encounter for observation for other suspected diseases and conditions ruled out: Secondary | ICD-10-CM | POA: Insufficient documentation

## 2011-11-24 DIAGNOSIS — R51 Headache: Secondary | ICD-10-CM | POA: Insufficient documentation

## 2011-11-24 DIAGNOSIS — R079 Chest pain, unspecified: Secondary | ICD-10-CM | POA: Insufficient documentation

## 2011-11-24 NOTE — ED Notes (Signed)
Pt reports hasn't been taking his tegretol for the past month.  Reports had a seizure yesterday and fell.  Reports was evaluated [facility] yesterday.  C/O pain in r ribs, legs sore, and headache.

## 2011-11-24 NOTE — ED Notes (Signed)
Called for pt x 3, pt was not in waiting room or outside ER entrance.

## 2011-12-16 ENCOUNTER — Emergency Department (HOSPITAL_COMMUNITY): Payer: Self-pay

## 2011-12-16 ENCOUNTER — Encounter (HOSPITAL_COMMUNITY): Payer: Self-pay

## 2011-12-16 ENCOUNTER — Emergency Department (HOSPITAL_COMMUNITY)
Admission: EM | Admit: 2011-12-16 | Discharge: 2011-12-16 | Disposition: A | Discharge status: Home / Self Care | Payer: Self-pay | Attending: Emergency Medicine | Admitting: Emergency Medicine

## 2011-12-16 DIAGNOSIS — F411 Generalized anxiety disorder: Secondary | ICD-10-CM | POA: Insufficient documentation

## 2011-12-16 DIAGNOSIS — R51 Headache: Secondary | ICD-10-CM | POA: Insufficient documentation

## 2011-12-16 DIAGNOSIS — Z79899 Other long term (current) drug therapy: Secondary | ICD-10-CM | POA: Insufficient documentation

## 2011-12-16 DIAGNOSIS — G40909 Epilepsy, unspecified, not intractable, without status epilepticus: Secondary | ICD-10-CM | POA: Insufficient documentation

## 2011-12-16 MED ORDER — KETOROLAC TROMETHAMINE 60 MG/2ML IM SOLN
60.0000 mg | Freq: Once | INTRAMUSCULAR | Status: DC
  Filled 2011-12-16: qty 2

## 2011-12-16 MED ORDER — HYDROCODONE-ACETAMINOPHEN 5-325 MG PO TABS: 1.0000 | ORAL_TABLET | ORAL | Status: AC | PRN

## 2011-12-16 NOTE — ED Notes (Signed)
Pt presents to ED with multiple c/o pain in head, eyes, abdomin, lower back and knees. Pt continues to ramble on about not wanting to hurt anyone else, doesn't want to continue like this anymore. Pt denies SI/HI. Pt states has pain all the time for over a year. EDP notified of multiple c/o.

## 2011-12-16 NOTE — ED Notes (Signed)
Complain of hurting all over for a year

## 2011-12-16 NOTE — ED Notes (Signed)
Pt called desk asking to leave AMA, secondary to no "strong pain medication given". EDP notified and spoke with said pt.

## 2011-12-16 NOTE — ED Notes (Signed)
Pt refused meds as ordered.

## 2011-12-17 NOTE — ED Provider Notes (Signed)
History     CSN: [id number]  Arrival date & time 12/16/11  1218   First MD Initiated Contact with Patient 12/16/11 1325      Chief Complaint  Patient presents with  . Generalized Body Aches    (Consider location/radiation/quality/duration/timing/severity/associated sxs/prior treatment) HPI Comments: Nathan Mckay presents with acute on chronic headache pain.  This patient has a history of seizure disorder and reports he has almost daily headaches which have been worsening over the past several weeks.  However he states he has headaches every day of his life.  Today's headache is unchanged from prior headaches, describing generalized throbbing pain across his bilateral for head.  He states his headaches tend to escalate until he has the seizure which will cause the headaches to back off temporarily.  His last seizure occurred last month and he was seen here at that time.  He does report taking his Tegretol as prescribed and hasn't missed any doses since his last seizure.  He denies fevers or chills, no neck pain or stiffness.  He also denies nausea or vomiting, visual changes, photophobia or phonophobia.  He also denies nasal congestion, sore throat and ear pain.   He also has complaints of lower back pain which radiates into his knees ankles and feet and is also a constant source of pain for him without change from his chronic low back problems.  He was seen by Dr. Gerilyn Pilgrim for chronic pain management and treatment of his seizure disorder,  but has not followed up with him in some time.  Patient expresses frustration today over his chronic pain symptoms and states "no one takes his symptoms  Seriously".  He is very anxious and frustrated, he denies suicidal and homicidal ideation.  He states "I should just have another seizure so my headache will get better for a while". He reports the only medicine that helps his pain his methadone and no one will prescribe this for him.  He is also desirous of a  neurology referral for further management of his chronic headaches and would like a second opinion about this problem.    The history is provided by the patient.    Past Medical History  Diagnosis Date  . Seizures     Past Surgical History  Procedure Date  . Hand surgery     No family history on file.  History  Substance Use Topics  . Smoking status: Current Everyday Smoker    Types: Cigarettes  . Smokeless tobacco: Not on file  . Alcohol Use: No      Review of Systems  Constitutional: Negative for fever.  HENT: Negative for congestion, sore throat and neck pain.   Eyes: Negative.   Respiratory: Negative for chest tightness and shortness of breath.   Cardiovascular: Negative for chest pain.  Gastrointestinal: Negative for nausea and abdominal pain.  Genitourinary: Negative.   Musculoskeletal: Negative for joint swelling and arthralgias.  Skin: Negative.  Negative for rash and wound.  Neurological: Positive for headaches. Negative for dizziness, weakness, light-headedness and numbness.  Hematological: Negative.   Psychiatric/Behavioral: Negative for suicidal ideas and confusion. The patient is nervous/anxious.     Allergies  Nickel  Home Medications   Current Outpatient Rx  Name Route Sig Dispense Refill  . ACETAMINOPHEN 325 MG PO TABS Oral Take 975 mg by mouth every 6 (six) hours as needed. pain    . CARBAMAZEPINE 200 MG PO TABS Oral Take 1 tablet (200 mg total) by mouth 3 (three)  times daily. 90 tablet 0  . HYDROCODONE-ACETAMINOPHEN 5-325 MG PO TABS Oral Take 1 tablet by mouth every 4 (four) hours as needed for pain. 20 tablet 0    BP 103/61  Pulse 100  Temp 98.2 F (36.8 C) (Oral)  Resp 20  Ht 5\' 10"  (1.778 m)  Wt 180 lb (81.647 kg)  BMI 25.83 kg/m2  SpO2 100%  Physical Exam  Nursing note and vitals reviewed. Constitutional: He is oriented to person, place, and time. He appears well-developed and well-nourished.       Uncomfortable appearing    HENT:  Head: Normocephalic and atraumatic.  Mouth/Throat: Oropharynx is clear and moist.  Eyes: EOM are normal. Pupils are equal, round, and reactive to light.  Neck: Normal range of motion. Neck supple.  Cardiovascular: Normal rate and normal heart sounds.   Pulmonary/Chest: Effort normal.  Abdominal: Soft. There is no tenderness.  Musculoskeletal: Normal range of motion.  Lymphadenopathy:    He has no cervical adenopathy.  Neurological: He is alert and oriented to person, place, and time. He has normal strength. No sensory deficit. Gait normal. GCS eye subscore is 4. GCS verbal subscore is 5. GCS motor subscore is 6.       Normal heel-shin, normal rapid alternating movements. Cranial nerves III-XII intact.  No pronator drift.  Skin: Skin is warm and dry. No rash noted.  Psychiatric: Thought content normal. His mood appears anxious. His affect is angry. His speech is not rapid and/or pressured. He is is hyperactive. Cognition and memory are normal. He expresses no homicidal and no suicidal ideation.    ED Course  Procedures (including critical care time)  Labs Reviewed - No data to display Ct Head Wo Contrast  12/16/2011  *RADIOLOGY REPORT*  Clinical Data: Left sided headache.  History of seizure.  CT HEAD WITHOUT CONTRAST  Technique:  Contiguous axial images were obtained from the base of the skull through the vertex without contrast.  Comparison: 09/29/2008.  05/20/2005.  Findings: The brain has a normal appearance without evidence of malformation, atrophy, old or acute infarction, mass lesion, hemorrhage, hydrocephalus or extra-axial collection.  The calvarium is unremarkable.  Sinuses, middle ears and mastoids are clear.  IMPRESSION: Normal head CT.   Original Report Authenticated By: Thomasenia Sales, M.D.      1. Headache       MDM  CT scan reviewed and discussed with patient.  He was given a short course of hydrocodone to help with his headache symptoms.  He is also referred to  [facility] neurology for further evaluation and management of his seizure disorder and his chronic headaches.  He was encouraged to return here for recheck if his symptoms worsen at any time.  Encouraged  to continue taking his Tegretol.  The patient appears reasonably screened and/or stabilized for discharge and I doubt any other medical condition or other [facility] requiring further screening, evaluation, or treatment in the ED at this time prior to discharge.   Burgess Amor, [place] 12/17/11 1759

## 2011-12-19 NOTE — ED Provider Notes (Signed)
Medical screening examination/treatment/procedure(s) were performed by non-physician practitioner and as supervising physician I was immediately available for consultation/collaboration. Kyngston Pickelsimer, MD, FACEP   Tiziana Cislo L Kellin Fifer, MD 12/19/11 0814 

## 2012-04-22 ENCOUNTER — Emergency Department (HOSPITAL_COMMUNITY): Payer: Self-pay

## 2012-04-22 ENCOUNTER — Encounter (HOSPITAL_COMMUNITY): Payer: Self-pay | Admitting: Emergency Medicine

## 2012-04-22 ENCOUNTER — Emergency Department (HOSPITAL_COMMUNITY)
Admission: EM | Admit: 2012-04-22 | Discharge: 2012-04-22 | Disposition: A | Discharge status: Home / Self Care | Payer: Self-pay | Attending: Emergency Medicine | Admitting: Emergency Medicine

## 2012-04-22 DIAGNOSIS — Z8739 Personal history of other diseases of the musculoskeletal system and connective tissue: Secondary | ICD-10-CM | POA: Insufficient documentation

## 2012-04-22 DIAGNOSIS — F17200 Nicotine dependence, unspecified, uncomplicated: Secondary | ICD-10-CM | POA: Insufficient documentation

## 2012-04-22 DIAGNOSIS — K921 Melena: Secondary | ICD-10-CM | POA: Insufficient documentation

## 2012-04-22 DIAGNOSIS — Z8679 Personal history of other diseases of the circulatory system: Secondary | ICD-10-CM | POA: Insufficient documentation

## 2012-04-22 DIAGNOSIS — Z79899 Other long term (current) drug therapy: Secondary | ICD-10-CM | POA: Insufficient documentation

## 2012-04-22 DIAGNOSIS — G8929 Other chronic pain: Secondary | ICD-10-CM | POA: Insufficient documentation

## 2012-04-22 DIAGNOSIS — F1910 Other psychoactive substance abuse, uncomplicated: Secondary | ICD-10-CM | POA: Insufficient documentation

## 2012-04-22 DIAGNOSIS — K625 Hemorrhage of anus and rectum: Secondary | ICD-10-CM | POA: Insufficient documentation

## 2012-04-22 DIAGNOSIS — G40909 Epilepsy, unspecified, not intractable, without status epilepticus: Secondary | ICD-10-CM | POA: Insufficient documentation

## 2012-04-22 DIAGNOSIS — M549 Dorsalgia, unspecified: Secondary | ICD-10-CM

## 2012-04-22 HISTORY — DX: Other chronic pain: G89.29

## 2012-04-22 HISTORY — DX: Recurrent dislocation, unspecified shoulder: M24.419

## 2012-04-22 HISTORY — DX: Headache: R51

## 2012-04-22 HISTORY — DX: Sciatica, unspecified side: M54.30

## 2012-04-22 HISTORY — DX: Dorsalgia, unspecified: M54.9

## 2012-04-22 HISTORY — DX: Other psychoactive substance abuse, uncomplicated: F19.10

## 2012-04-22 LAB — COMPREHENSIVE METABOLIC PANEL
ALT: 26 U/L (ref 0–53)
Albumin: 4.1 g/dL (ref 3.5–5.2)
Alkaline Phosphatase: 76 U/L (ref 39–117)
Calcium: 9.6 mg/dL (ref 8.4–10.5)
Potassium: 3.9 mEq/L (ref 3.5–5.1)
Sodium: 141 mEq/L (ref 135–145)
Total Protein: 7.1 g/dL (ref 6.0–8.3)

## 2012-04-22 LAB — CBC WITH DIFFERENTIAL/PLATELET
Basophils Relative: 0 % (ref 0–1)
Eosinophils Absolute: 0.1 10*3/uL (ref 0.0–0.7)
MCH: 30.3 pg (ref 26.0–34.0)
MCHC: 33 g/dL (ref 30.0–36.0)
Neutrophils Relative %: 70 % (ref 43–77)
Platelets: 191 10*3/uL (ref 150–400)
RDW: 13.2 % (ref 11.5–15.5)

## 2012-04-22 LAB — PROTIME-INR: Prothrombin Time: 12.1 seconds (ref 11.6–15.2)

## 2012-04-22 MED ORDER — ACETAMINOPHEN 500 MG PO TABS
1000.0000 mg | ORAL_TABLET | Freq: Once | ORAL | Status: AC
  Administered 2012-04-22: 1000 mg via ORAL
  Filled 2012-04-22: qty 2

## 2012-04-22 MED ORDER — METAXALONE 800 MG PO TABS: 800.0000 mg | ORAL_TABLET | Freq: Three times a day (TID) | ORAL | Status: DC | PRN

## 2012-04-22 MED ORDER — IBUPROFEN 400 MG PO TABS
400.0000 mg | ORAL_TABLET | Freq: Once | ORAL | Status: AC
  Administered 2012-04-22: 400 mg via ORAL
  Filled 2012-04-22: qty 1

## 2012-04-22 NOTE — ED Notes (Signed)
Pt states has history of rectal bleeding. Pt states this time it has been going on for a couple weeks. Sometimes BRB and sometimes black stools. Pt having abd pain as well. As been checked for rectal bleeding before at urgent care, but he states they didn't find anything.

## 2012-04-22 NOTE — ED Provider Notes (Signed)
History     CSN: [id number]  Arrival date & time 04/22/12  1035   First MD Initiated Contact with Patient 04/22/12 1137      Chief Complaint  Patient presents with  . Rectal Bleeding     HPI Pt was seen at 1210.   Per pt, c/o gradual onset and persistence of intermittent episodes of rectal bleeding for several months.  Pt states that "sometimes" his stools are "black."  States the rectal bleeding often occurs after having a BM.  Denies rectal pain, no rectal discharge. Endorses he has been eval multiple times at a local [facility] for same and was told he "was ok."  Has not f/u with PMD or GI MD for same.  Pt also c/o gradual onset and persistence of constant acute flair of his chronic low back "pain" for the past several years.  Denies any change in his usual chronic pain pattern, radiating to his knees and ankles.  Pain worsens with palpation of the area and body position changes.  States he "used to be on methadone" for this pain. Denies incont/retention of bowel or bladder, no saddle anesthesia, no focal motor weakness, no tingling/numbness in extremities, no fevers, no injury, no abd pain.  The patient has a significant history of similar symptoms previously, recently being evaluated for this complaint and multiple prior evals for same.      Past Medical History  Diagnosis Date  . Seizures   . Chronic headaches   . Chronic back pain   . Polysubstance abuse   . Recurrent shoulder dislocation   . Sciatica     Past Surgical History  Procedure Date  . Hand surgery      History  Substance Use Topics  . Smoking status: Current Every Day Smoker    Types: Cigarettes  . Smokeless tobacco: Not on file  . Alcohol Use: No      Review of Systems ROS: Statement: All systems negative except as marked or noted in the HPI; Constitutional: Negative for fever and chills. ; ; Eyes: Negative for eye pain, redness and discharge. ; ; ENMT: Negative for ear pain, hoarseness, nasal congestion,  sinus pressure and sore throat. ; ; Cardiovascular: Negative for chest pain, palpitations, diaphoresis, dyspnea and peripheral edema. ; ; Respiratory: Negative for cough, wheezing and stridor. ; ; Gastrointestinal: Negative for nausea, vomiting, diarrhea, abdominal pain, blood in stool, hematemesis, jaundice and +rectal bleeding. . ; ; Genitourinary: Negative for dysuria, flank pain and hematuria. ; ; Musculoskeletal: +LBP. Negative for neck pain. Negative for swelling and trauma.; ; Skin: Negative for pruritus, rash, abrasions, blisters, bruising and skin lesion.; ; Neuro: Negative for headache, lightheadedness and neck stiffness. Negative for weakness, altered level of consciousness , altered mental status, extremity weakness, paresthesias, involuntary movement, seizure and syncope.       Allergies  Nickel  Home Medications   Current Outpatient Rx  Name  Route  Sig  Dispense  Refill  . ACETAMINOPHEN 325 MG PO TABS   Oral   Take 975 mg by mouth every 6 (six) hours as needed. pain         . METAXALONE 800 MG PO TABS   Oral   Take 1 tablet (800 mg total) by mouth 3 (three) times daily as needed for pain (muscle spasm).   15 tablet   0     BP 129/70  Pulse 88  Temp 98.2 F (36.8 C) (Oral)  Resp 18  Ht 5\' 9"  (1.753 m)  Wt 180 lb (81.647 kg)  BMI 26.58 kg/m2  SpO2 100%  Physical Exam 1215: Physical examination:  Nursing notes reviewed; Vital signs and O2 SAT reviewed;  Constitutional: Well developed, Well nourished, Well hydrated, In no acute distress; Head:  Normocephalic, atraumatic; Eyes: EOMI, PERRL, No scleral icterus; ENMT: Mouth and pharynx normal, Mucous membranes moist; Neck: Supple, Full range of motion, No lymphadenopathy; Cardiovascular: Regular rate and rhythm, No murmur, rub, or gallop; Respiratory: Breath sounds clear & equal bilaterally, No rales, rhonchi, wheezes.  Speaking full sentences with ease, Normal respiratory effort/excursion; Chest: Nontender, Movement  normal; Abdomen: Soft, Nontender, Nondistended, Normal bowel sounds. Rectal exam performed w/permission of pt and ED RN chaparone present.  Anal tone normal.  Non-tender, soft brown stool in rectal vault, heme neg.  No fissures, no external hemorrhoids, no palp masses.;;; Genitourinary: No CVA tenderness; Spine:  No midline CS, TS, LS tenderness.;; Extremities: Pulses normal, No tenderness, No edema, No calf edema or asymmetry.; Neuro: AA&Ox3, Major CN grossly intact.  Speech clear. Climbs on and off stretcher easily by himself. Gait steady. No gross focal motor or sensory deficits in extremities.; Skin: Color normal, Warm, Dry.   ED Course  Procedures     MDM  MDM Reviewed: nursing note, vitals and previous chart Interpretation: labs   Results for orders placed during the hospital encounter of 04/22/12  CBC WITH DIFFERENTIAL      Component Value Range   WBC 9.8  4.0 - 10.5 K/uL   RBC 4.78  4.22 - 5.81 MIL/uL   Hemoglobin 14.5  13.0 - 17.0 g/dL   HCT 11.9  14.7 - 82.9 %   MCV 91.8  78.0 - 100.0 fL   MCH 30.3  26.0 - 34.0 pg   MCHC 33.0  30.0 - 36.0 g/dL   RDW 56.2  13.0 - 86.5 %   Platelets 191  150 - 400 K/uL   Neutrophils Relative 70  43 - 77 %   Neutro Abs 6.9  1.7 - 7.7 K/uL   Lymphocytes Relative 23  12 - 46 %   Lymphs Abs 2.3  0.7 - 4.0 K/uL   Monocytes Relative 6  3 - 12 %   Monocytes Absolute 0.6  0.1 - 1.0 K/uL   Eosinophils Relative 1  0 - 5 %   Eosinophils Absolute 0.1  0.0 - 0.7 K/uL   Basophils Relative 0  0 - 1 %   Basophils Absolute 0.0  0.0 - 0.1 K/uL  COMPREHENSIVE METABOLIC PANEL      Component Value Range   Sodium 141  135 - 145 mEq/L   Potassium 3.9  3.5 - 5.1 mEq/L   Chloride 107  96 - 112 mEq/L   CO2 26  19 - 32 mEq/L   Glucose, Bld 98  70 - 99 mg/dL   BUN 9  6 - 23 mg/dL   Creatinine, Ser 7.84  0.50 - 1.35 mg/dL   Calcium 9.6  8.4 - 69.6 mg/dL   Total Protein 7.1  6.0 - 8.3 g/dL   Albumin 4.1  3.5 - 5.2 g/dL   AST 18  0 - 37 U/L   ALT 26  0 - 53  U/L   Alkaline Phosphatase 76  39 - 117 U/L   Total Bilirubin 0.2 (*) 0.3 - 1.2 mg/dL   GFR calc non Af Amer 88 (*) >90 mL/min   GFR calc Af Amer >90  >90 mL/min  PROTIME-INR      Component Value  Range   Prothrombin Time 12.1  11.6 - 15.2 seconds   INR 0.90  0.00 - 1.49     1300:  No rectal bleeding on exam.  Stool heme negative.  H/H and VS stable.  Pt refuses any imaging studies and continually requesting "some pain meds" for his chronic back pain.  Aware I will not rx narcotic pain meds for him at this time.  States he just wants to leave now.  Long hx of chronic pain with multiple ED visits for same.  Pt endorses acute flair of his usual long standing chronic pain today, no change from his usual chronic pain pattern.  Pt encouraged to f/u with his PMD and Pain Management doctor for good continuity of care and control of his chronic pain.  Pt also encouraged to f/u with GI MD regarding further investigation of his rectal bleeding.  Verb understanding.  Dx and testing d/w pt.  Questions answered.  Verb understanding, agreeable to d/c home with outpt f/u.             Laray Anger, DO 04/23/12 2020

## 2012-07-13 ENCOUNTER — Encounter (HOSPITAL_COMMUNITY): Payer: Self-pay | Admitting: *Deleted

## 2012-07-13 ENCOUNTER — Emergency Department (HOSPITAL_COMMUNITY): Payer: Self-pay

## 2012-07-13 ENCOUNTER — Inpatient Hospital Stay (HOSPITAL_COMMUNITY)
Admission: EM | Admit: 2012-07-13 | Discharge: 2012-07-14 | DRG: 917 | Disposition: A | Discharge status: Home / Self Care | Payer: MEDICAID | Attending: Internal Medicine | Admitting: Internal Medicine

## 2012-07-13 ENCOUNTER — Emergency Department (HOSPITAL_COMMUNITY)
Admission: EM | Admit: 2012-07-13 | Discharge: 2012-07-13 | Discharge status: Against Medical Advice | Payer: Self-pay | Attending: Emergency Medicine | Admitting: Emergency Medicine

## 2012-07-13 DIAGNOSIS — T438X2A Poisoning by other psychotropic drugs, intentional self-harm, initial encounter: Secondary | ICD-10-CM | POA: Diagnosis present

## 2012-07-13 DIAGNOSIS — I959 Hypotension, unspecified: Secondary | ICD-10-CM | POA: Diagnosis present

## 2012-07-13 DIAGNOSIS — Y939 Activity, unspecified: Secondary | ICD-10-CM | POA: Insufficient documentation

## 2012-07-13 DIAGNOSIS — R51 Headache: Secondary | ICD-10-CM | POA: Diagnosis present

## 2012-07-13 DIAGNOSIS — F489 Nonpsychotic mental disorder, unspecified: Secondary | ICD-10-CM | POA: Diagnosis present

## 2012-07-13 DIAGNOSIS — F1010 Alcohol abuse, uncomplicated: Secondary | ICD-10-CM | POA: Diagnosis present

## 2012-07-13 DIAGNOSIS — R4182 Altered mental status, unspecified: Secondary | ICD-10-CM | POA: Insufficient documentation

## 2012-07-13 DIAGNOSIS — T43502A Poisoning by unspecified antipsychotics and neuroleptics, intentional self-harm, initial encounter: Secondary | ICD-10-CM | POA: Diagnosis present

## 2012-07-13 DIAGNOSIS — F1910 Other psychoactive substance abuse, uncomplicated: Secondary | ICD-10-CM | POA: Diagnosis present

## 2012-07-13 DIAGNOSIS — S81809A Unspecified open wound, unspecified lower leg, initial encounter: Secondary | ICD-10-CM | POA: Insufficient documentation

## 2012-07-13 DIAGNOSIS — G929 Unspecified toxic encephalopathy: Secondary | ICD-10-CM | POA: Diagnosis present

## 2012-07-13 DIAGNOSIS — T400X1A Poisoning by opium, accidental (unintentional), initial encounter: Principal | ICD-10-CM | POA: Diagnosis present

## 2012-07-13 DIAGNOSIS — T403X4A Poisoning by methadone, undetermined, initial encounter: Secondary | ICD-10-CM

## 2012-07-13 DIAGNOSIS — G40909 Epilepsy, unspecified, not intractable, without status epilepticus: Secondary | ICD-10-CM | POA: Diagnosis present

## 2012-07-13 DIAGNOSIS — T424X4A Poisoning by benzodiazepines, undetermined, initial encounter: Secondary | ICD-10-CM | POA: Diagnosis present

## 2012-07-13 DIAGNOSIS — Z7289 Other problems related to lifestyle: Secondary | ICD-10-CM

## 2012-07-13 DIAGNOSIS — S81811A Laceration without foreign body, right lower leg, initial encounter: Secondary | ICD-10-CM

## 2012-07-13 DIAGNOSIS — W19XXXA Unspecified fall, initial encounter: Secondary | ICD-10-CM | POA: Diagnosis present

## 2012-07-13 DIAGNOSIS — X58XXXA Exposure to other specified factors, initial encounter: Secondary | ICD-10-CM | POA: Insufficient documentation

## 2012-07-13 DIAGNOSIS — T394X2A Poisoning by antirheumatics, not elsewhere classified, intentional self-harm, initial encounter: Secondary | ICD-10-CM | POA: Diagnosis present

## 2012-07-13 DIAGNOSIS — S81009A Unspecified open wound, unspecified knee, initial encounter: Secondary | ICD-10-CM | POA: Diagnosis present

## 2012-07-13 DIAGNOSIS — R400 Somnolence: Secondary | ICD-10-CM

## 2012-07-13 DIAGNOSIS — F1410 Cocaine abuse, uncomplicated: Secondary | ICD-10-CM | POA: Diagnosis present

## 2012-07-13 DIAGNOSIS — F17200 Nicotine dependence, unspecified, uncomplicated: Secondary | ICD-10-CM | POA: Diagnosis present

## 2012-07-13 DIAGNOSIS — Y929 Unspecified place or not applicable: Secondary | ICD-10-CM | POA: Insufficient documentation

## 2012-07-13 DIAGNOSIS — G8929 Other chronic pain: Secondary | ICD-10-CM | POA: Diagnosis present

## 2012-07-13 DIAGNOSIS — M549 Dorsalgia, unspecified: Secondary | ICD-10-CM | POA: Diagnosis present

## 2012-07-13 DIAGNOSIS — M5430 Sciatica, unspecified side: Secondary | ICD-10-CM | POA: Diagnosis present

## 2012-07-13 DIAGNOSIS — G92 Toxic encephalopathy: Secondary | ICD-10-CM | POA: Diagnosis present

## 2012-07-13 DIAGNOSIS — T403X1A Poisoning by methadone, accidental (unintentional), initial encounter: Secondary | ICD-10-CM

## 2012-07-13 LAB — CBC WITH DIFFERENTIAL/PLATELET
Basophils Absolute: 0 10*3/uL (ref 0.0–0.1)
HCT: 44.1 % (ref 39.0–52.0)
Lymphocytes Relative: 35 % (ref 12–46)
Lymphs Abs: 3.9 10*3/uL (ref 0.7–4.0)
Monocytes Absolute: 0.6 10*3/uL (ref 0.1–1.0)
Neutro Abs: 6.5 10*3/uL (ref 1.7–7.7)
RBC: 5 MIL/uL (ref 4.22–5.81)
RDW: 12.5 % (ref 11.5–15.5)
WBC: 11.1 10*3/uL — ABNORMAL HIGH (ref 4.0–10.5)

## 2012-07-13 LAB — RAPID URINE DRUG SCREEN, HOSP PERFORMED
Benzodiazepines: POSITIVE — AB
Cocaine: POSITIVE — AB
Opiates: NOT DETECTED
Tetrahydrocannabinol: NOT DETECTED

## 2012-07-13 LAB — COMPREHENSIVE METABOLIC PANEL
Albumin: 4.6 g/dL (ref 3.5–5.2)
Alkaline Phosphatase: 79 U/L (ref 39–117)
BUN: 13 mg/dL (ref 6–23)
Calcium: 9.4 mg/dL (ref 8.4–10.5)
Creatinine, Ser: 1.19 mg/dL (ref 0.50–1.35)
GFR calc Af Amer: >90 mL/min (ref >90)
Glucose, Bld: 112 mg/dL — ABNORMAL HIGH (ref 70–99)
Potassium: 4.2 mEq/L (ref 3.5–5.1)
Total Protein: 7.7 g/dL (ref 6.0–8.3)

## 2012-07-13 LAB — ACETAMINOPHEN LEVEL
Acetaminophen (Tylenol), Serum: <15 ug/mL (ref 10–30)
Acetaminophen (Tylenol), Serum: <15 ug/mL (ref 10–30)

## 2012-07-13 LAB — SALICYLATE LEVEL: Salicylate Lvl: 6.5 mg/dL (ref 2.8–20.0)

## 2012-07-13 MED ORDER — NALOXONE HCL 0.4 MG/ML IJ SOLN
0.4000 mg | Freq: Once | INTRAMUSCULAR | Status: AC
  Administered 2012-07-13: 0.4 mg via INTRAVENOUS
  Filled 2012-07-13: qty 1

## 2012-07-13 MED ORDER — TETANUS-DIPHTH-ACELL PERTUSSIS 5-2.5-18.5 LF-MCG/0.5 IM SUSP
0.5000 mL | Freq: Once | INTRAMUSCULAR | Status: AC
  Administered 2012-07-13: 0.5 mL via INTRAMUSCULAR
  Filled 2012-07-13: qty 0.5

## 2012-07-13 MED ORDER — NALOXONE HCL 0.4 MG/ML IJ SOLN
INTRAMUSCULAR | Status: AC
  Filled 2012-07-13: qty 1

## 2012-07-13 MED ORDER — LIDOCAINE-EPINEPHRINE (PF) 2 %-1:200000 IJ SOLN
INTRAMUSCULAR | Status: AC
  Administered 2012-07-13: 20 mL
  Filled 2012-07-13: qty 20

## 2012-07-13 MED ORDER — ONDANSETRON HCL 4 MG/2ML IJ SOLN: 4.0000 mg | Freq: Four times a day (QID) | INTRAMUSCULAR | Status: DC | PRN

## 2012-07-13 MED ORDER — LIDOCAINE-EPINEPHRINE 2 %-1:100000 IJ SOLN
20.0000 mL | Freq: Once | INTRAMUSCULAR | Status: DC
Start: 2012-07-13 — End: 2012-07-13

## 2012-07-13 MED ORDER — LORAZEPAM 2 MG/ML IJ SOLN: 1.0000 mg | INTRAMUSCULAR | Status: DC | PRN

## 2012-07-13 MED ORDER — ONDANSETRON HCL 4 MG PO TABS: 4.0000 mg | ORAL_TABLET | Freq: Four times a day (QID) | ORAL | Status: DC | PRN

## 2012-07-13 MED ORDER — SODIUM CHLORIDE 0.9 % IV BOLUS (SEPSIS)
1000.0000 mL | Freq: Once | INTRAVENOUS | Status: AC
  Administered 2012-07-13: 1000 mL via INTRAVENOUS

## 2012-07-13 MED ORDER — NALOXONE HCL 1 MG/ML IJ SOLN
2.0000 mg/h | INTRAVENOUS | Status: DC
  Administered 2012-07-13: 2 mg/h via INTRAVENOUS
  Administered 2012-07-13: 0.25 mg/h via INTRAVENOUS
  Administered 2012-07-13 – 2012-07-14 (×4): 2 mg/h via INTRAVENOUS
  Filled 2012-07-13 (×4): qty 4

## 2012-07-13 MED ORDER — NALOXONE HCL 0.4 MG/ML IJ SOLN
0.4000 mg | Freq: Once | INTRAMUSCULAR | Status: AC
  Administered 2012-07-13: 0.4 mg via INTRAVENOUS

## 2012-07-13 MED ORDER — NALOXONE HCL 0.4 MG/ML IJ SOLN
1.0000 mg | Freq: Once | INTRAMUSCULAR | Status: DC
  Filled 2012-07-13: qty 3

## 2012-07-13 MED ORDER — HALOPERIDOL LACTATE 5 MG/ML IJ SOLN: 5.0000 mg | Freq: Four times a day (QID) | INTRAMUSCULAR | Status: DC | PRN

## 2012-07-13 MED ORDER — THIAMINE HCL 100 MG/ML IJ SOLN
100.0000 mg | Freq: Every day | INTRAMUSCULAR | Status: DC
  Administered 2012-07-13: 100 mg via INTRAVENOUS
  Filled 2012-07-13: qty 2

## 2012-07-13 MED ORDER — NICOTINE 21 MG/24HR TD PT24
21.0000 mg | MEDICATED_PATCH | Freq: Every day | TRANSDERMAL | Status: DC
  Administered 2012-07-14 (×2): 21 mg via TRANSDERMAL
  Filled 2012-07-13 (×2): qty 1

## 2012-07-13 MED ORDER — HALOPERIDOL LACTATE 5 MG/ML IJ SOLN
INTRAMUSCULAR | Status: AC
  Administered 2012-07-13: 5 mg
  Filled 2012-07-13: qty 1

## 2012-07-13 MED ORDER — LORAZEPAM 2 MG/ML IJ SOLN
INTRAMUSCULAR | Status: AC
  Administered 2012-07-13: 2 mg
  Filled 2012-07-13: qty 1

## 2012-07-13 MED ORDER — SODIUM CHLORIDE 0.9 % IV SOLN
INTRAVENOUS | Status: DC
  Administered 2012-07-13 – 2012-07-14 (×4): via INTRAVENOUS

## 2012-07-13 MED ORDER — ENOXAPARIN SODIUM 40 MG/0.4ML ~~LOC~~ SOLN
40.0000 mg | SUBCUTANEOUS | Status: DC
  Administered 2012-07-13 – 2012-07-14 (×2): 40 mg via SUBCUTANEOUS
  Filled 2012-07-13 (×2): qty 0.4

## 2012-07-13 NOTE — H&P (Addendum)
[facility] History and Physical  KEANTHONY POOLE ZOX:[id number] DOB: Nov 28, 1975 DOA: 07/13/2012  Referring physician: Devoria Albe PCP: No PCP Per Patient  Specialists:   Chief Complaint: AMS/overdose  HPI: Nathan Mckay is a 37 y.o. male with pmhx polysubstance abuse, headaches, seizures, chronic back pain who presents to ED with cc altered mental status and drug overdose. Information obtained from mother via phone as pt altered and combative. Mother reports pt was "fired" from pain clinic several months ago and since then has been "doing whatever he can to get drugs". She reports last night around midnight he took methadone, dilaudid and xanax from her. She is uncertain of how much of each. She also reports he was drinking ETOH as well.  She states the pt fell sometime during the night . This am he was difficult to arouse and had laceration to right LE so she called 911, In ED urine drug screen + for benzo and cocaine. In addition  pt somnolent requiring 3 doses of narcan and ultimately a narcan drip. In addition SBP dropped to 80's and he was given IV fluid bolus. Poison control consulted who advised admission, narcan drip and monitoring of BP as pressors may be necessary. [facility] asked to admit   Review of Systems:   Unable to obtain at this time due to altered mental status. Mother did indicate no recent illness except for chronic back pain and drug abuse.   Past Medical History  Diagnosis Date  . Seizures   . Chronic headaches   . Chronic back pain   . Polysubstance abuse   . Recurrent shoulder dislocation   . Sciatica    Past Surgical History  Procedure Laterality Date  . Hand surgery     Social History:  reports that he has been smoking Cigarettes.  He has been smoking about 0.00 packs per day. He does not have any smokeless tobacco history on file. He reports that he does not drink alcohol or use illicit drugs. Pt lives with girlfriend and is independent with  ADL's  Allergies  Allergen Reactions  . Nickel Rash    History reviewed. No pertinent family history. Mother with hx of lung cancer. Father hx unknown  Prior to Admission medications   Medication Sig Start Date End Date Taking? Authorizing Provider  acetaminophen (TYLENOL) 325 MG tablet Take 975 mg by mouth every 6 (six) hours as needed. pain   Yes Historical Provider, MD   Physical Exam: Filed Vitals:   07/13/12 0916 07/13/12 0927 07/13/12 0930 07/13/12 0945  BP: 89/55 104/66 95/61 99/71   Pulse: 83 81 79 86  Temp:      TempSrc:      Resp: 9 15 14 15   Height:      Weight:      SpO2: 97% 99% 100% 100%     General:  Well nourished, combative when awake, lethargic when not stimulated  Eyes: PERRL EOMI no scleral icterus  ENT: ears clear no nasal drainage mucus membranes dry pink  Neck: supple full rom  Cardiovascular: RRR No MGR No LE edema  Respiratory: shallow when not stimulated. BS clear no wheeze, rhonchi  Abdomen: flat soft +BS non-tender to palpation  Skin: laceration to right LE. Otherwise no rash/lesion  Musculoskeletal: moves all extremities   Psychiatric: combative when awak  Neurologic: cranial nerve II-XII intact oriented to person place only. Combative when awake otherwise very lethargic but  responds to verbal stimuli.   Labs on Admission:  Basic Metabolic  Panel:  Recent Labs Lab 07/13/12 0345  NA 139  K 4.2  CL 103  CO2 23  GLUCOSE 112*  BUN 13  CREATININE 1.19  CALCIUM 9.4   Liver Function Tests:  Recent Labs Lab 07/13/12 0345  AST 24  ALT 27  ALKPHOS 79  BILITOT 0.2*  PROT 7.7  ALBUMIN 4.6   No results found for this basename: LIPASE, AMYLASE,  in the last 168 hours No results found for this basename: AMMONIA,  in the last 168 hours CBC:  Recent Labs Lab 07/13/12 0345  WBC 11.1*  NEUTROABS 6.5  HGB 15.4  HCT 44.1  MCV 88.2  PLT 237   Cardiac Enzymes: No results found for this basename: CKTOTAL, CKMB, CKMBINDEX,  TROPONINI,  in the last 168 hours  BNP (last 3 results) No results found for this basename: PROBNP,  in the last 8760 hours CBG:  Recent Labs Lab 07/13/12 0437  GLUCAP 109*    Radiological Exams on Admission: Dg Tibia/fibula Right  07/13/2012  *RADIOLOGY REPORT*  Clinical Data: Laceration to the mid shaft lateral side of the right lower leg.  RIGHT TIBIA AND FIBULA - 2 VIEW  Comparison: None.  Findings: The right tibia and fibula appear intact.  No evidence of acute fracture or subluxation.  No focal bone lesion or bone destruction.  There is fragmentation of the tibial tubercle with overlying soft tissue swelling suggesting Osgood-Schlatter change. No radiopaque soft tissue foreign bodies are demonstrated.  IMPRESSION: No acute bony abnormalities.  No radiopaque soft tissue foreign bodies.   Original Report Authenticated By: Burman Nieves, M.D.    Ct Head Wo Contrast  07/13/2012  *RADIOLOGY REPORT*  Clinical Data: Altered mental status.  History of seizures.  CT HEAD WITHOUT CONTRAST  Technique:  Contiguous axial images were obtained from the base of the skull through the vertex without contrast.  Comparison: 12/16/2011  Findings: The ventricles and sulci are symmetrical without significant effacement, displacement, or dilatation. No mass effect or midline shift. No abnormal extra-axial fluid collections. The grey-white matter junction is distinct. Basal cisterns are not effaced. No acute intracranial hemorrhage. No depressed skull fractures.  Visualized paranasal sinuses and mastoid air cells are not opacified.  No significant changes since the previous study.  IMPRESSION: No acute intracranial abnormalities.   Original Report Authenticated By: Burman Nieves, M.D.     EKG: Independently reviewed. NSR  Assessment/Plan Principal Problem:   Benzodiazepine and opiate Overdose with alcohol and cocaine abuse, resulting in cardiopulmonary depression: admit to ICU. Reported ingestion of xanax,  methadone, dilaudid and ETOH. UDS positive for benzo, cocaine, ETOH 104. Poison control consulted recommending narcan drip, monitoring of BP 24 hours. Methadone has a long half-life, and patient may require prolonged observation and Narcan. Continue drip. IV fluid.  Patient will also get thiamin. Alcohol intake habits unknown. Will give IV Ativan as needed. IV Haldol as needed for severe agitation. Once patient is awake, will need to determine whether this was an intentional or accidental overdose.    Toxic encephalopathy: related to #1. Monitor in ICU. Pt became combative and wanting to leave AMA. IVC papers initiated.  Concern over pt hx drug abuse and likely withdrawal. Will provide prn haldol and ativan for management. Patient does not at this time have capacity for informed consent, and therefore may not leave AMA unless he becomes significantly more lucid.    Hypotension: required 1L NS and will continue rate at 125/hour. Monitor closely. If unable to maintain adequate pressor will  use vasopressors.    Polysubstance abuse: see #1 and #2. Will request SW consult when acute phase of illness resolved  Right leg laceration, status post suture repair by ED physician  Code Status: full Family Communication: mother via phone Disposition Plan: home when ready  Time spent: 65 minutes  [facility] If 7PM-7AM, please contact night-coverage [email or web address] Password [facility] 07/13/2012, 10:00 AM  Attending note  Discussed with Ms. Vedia Coffer. Chart reviewed. Patient examined. Blood pressure, respiratory rate improved. Patient is resting calmly currently.  Above note ammended. Agree with above.  Crista Curb, M.D.

## 2012-07-13 NOTE — ED Notes (Signed)
Pt awake, answering questions, still obtunded but more alert

## 2012-07-13 NOTE — ED Notes (Signed)
Pt now awake, asking how much longer he is going to be in here.  Pupils pinpoint.  Respirations now within normal limits.

## 2012-07-13 NOTE — ED Notes (Signed)
Belongings placed in locker per protocol, security at bedside, pt wanded per protocol.

## 2012-07-13 NOTE — ED Notes (Signed)
After narcan administration respiratory rate within normal limits at 14-15 per minute, pt continues to sleep.

## 2012-07-13 NOTE — ED Notes (Signed)
The reason for the NPO diet is explained to the patient, who states that nurses are lying to him and he did not do any drugs last night.  States that he took his normal dose of methadone last night and nothing more.  The patient was again questioned about the drug screen showing positive for cocaine, he confirms the use of cocaine with another nurse, however, continues to deny use to me.

## 2012-07-13 NOTE — ED Notes (Signed)
Pt now awake, irritable, has pulled sheet over his head.

## 2012-07-13 NOTE — ED Notes (Signed)
Respiratory rate variable with rates of 10-14, MD made aware.

## 2012-07-13 NOTE — ED Provider Notes (Signed)
History     CSN: [id number]  Arrival date & time 07/13/12  4098   First MD Initiated Contact with Patient 07/13/12 [phone]      Chief Complaint  Patient presents with  . Extremity Laceration    Rt lower shin  . Altered Mental Status     Patient is a 37 y.o. male presenting with altered mental status. The history is provided by the EMS personnel. The history is limited by the condition of the patient.  Altered Mental Status This is a new problem. Episode onset: unknown time ago. The problem occurs constantly. The problem has been gradually worsening. Nothing aggravates the symptoms. Nothing relieves the symptoms.  pt presents via EMS from home for altered mental status and injury to right leg Apparently, pt fell tonight, but causes of fall are unknown and exact time of fall are unknown, however it is reported that he has been drinking ETOH and possibly using prescription drugs. Per EMS, mother thinks that patient has been stealing her methadone and dilaudid.  Apparently there are large quantities of both drugs missing  Past Medical History  Diagnosis Date  . Seizures   . Chronic headaches   . Chronic back pain   . Polysubstance abuse   . Recurrent shoulder dislocation   . Sciatica     Past Surgical History  Procedure Laterality Date  . Hand surgery      History reviewed. No pertinent family history.  History  Substance Use Topics  . Smoking status: Current Every Day Smoker    Types: Cigarettes  . Smokeless tobacco: Not on file  . Alcohol Use: No      Review of Systems  Unable to perform ROS: Mental status change  Psychiatric/Behavioral: Positive for altered mental status.    Allergies  Nickel  Home Medications   Current Outpatient Rx  Name  Route  Sig  Dispense  Refill  . acetaminophen (TYLENOL) 325 MG tablet   Oral   Take 975 mg by mouth every 6 (six) hours as needed. pain         . metaxalone (SKELAXIN) 800 MG tablet   Oral   Take 1 tablet (800 mg  total) by mouth 3 (three) times daily as needed for pain (muscle spasm).   15 tablet   0     BP 110/68  Pulse 108  Temp(Src) 98 F (36.7 C) (Oral)  Resp 18  Ht 5\' 9"  (1.753 m)  Wt 188 lb (85.276 kg)  BMI 27.75 kg/m2  SpO2 97%  Physical Exam CONSTITUTIONAL: Well developed HEAD: Normocephalic/atraumatic, no signs of trauma EYES: pinpoint pupils noted bilaterally.  No nystagmus ENMT: Mucous membranes moist, no signs of trauma NECK: supple no meningeal signs SPINE:no bruising noted to back CV: S1/S2 noted, no murmurs/rubs/gallops noted LUNGS: Lungs are clear to auscultation bilaterally, no apparent distress ABDOMEN: soft, nontender, no rebound or guarding NEURO: Pt is somnolent but he is arousable to pain EXTREMITIES: pulses normal, laceration noted to right LE on proximal shin.  Bleeding controlled.  No deformity noted to his extremities. All other extremities/joints palpated/ranged and nontender SKIN: warm, color normal PSYCH: somnolent  ED Course  Procedures  CRITICAL CARE Performed by: Joya Gaskins   Total critical care time: 31  Critical care time was exclusive of separately billable procedures and treating other patients.  Critical care was necessary to treat or prevent imminent or life-threatening deterioration.  Critical care was time spent personally by me on the following activities: development of treatment  plan with patient and/or surrogate as well as nursing, evaluation of patient's response to treatment, examination of patient, obtaining history from patient or surrogate, ordering and performing treatments and interventions, ordering and review of laboratory studies, ordering and review of radiographic studies, pulse oximetry and re-evaluation of patient's condition.  LACERATION REPAIR Performed by: Joya Gaskins Patient identity confirmed: provided demographic data Prepped and Draped in normal sterile fashion Wound explored Laceration Location:  right shin  Laceration Length: 4 cm No Foreign Bodies seen or palpated Anesthesia: local infiltration Local anesthetic: lidocaine 2% with epinephrine Anesthetic total: 4 ml Irrigation method: syringe Amount of cleaning: standard Skin closure: simple Number of sutures or staples: 4 staples Technique: stapling Patient tolerance: Patient tolerated the procedure well with no immediate complications.   Labs Reviewed  CBC WITH DIFFERENTIAL - Abnormal; Notable for the following:    WBC 11.1 (*)    All other components within normal limits  COMPREHENSIVE METABOLIC PANEL  ACETAMINOPHEN LEVEL  SALICYLATE LEVEL  URINE RAPID DRUG SCREEN (HOSP PERFORMED)  ETHANOL   4:47 AM Pt seen after EMS transported patient to the ED It is reported he has been drinking and taking narcotic pain meds.  He injured his right LE but unclear cause During my exam, pt woke up and became combative and agitated.  He tried to stand up and kept asking "what am I being charged with"  Due to his combativeness and agitation, ativan and haldol given to patient. He may have a head injury causing his somnolence. He is now calm/resting comfortably.  CT head ordered.  Will follow closely. 5:45 AM Pt began to have bradypnea and mild hypotension after CT imaging performed Narcan 0.4mg  given to patient.  His resp effort began to improve and he woke up.   Pt denies use of methadone, however there was concern by his mother that he had used her methadone Pt requesting discharge, however I feel he should be monitored for at least another two hours to ensure he has no recurrence of his somnolence.  He agrees to be monitored He has no other complaints.  He has h/o seizures in the past, but no recent seizures reported Suspect his initial presentation likely due to polysubstance abuse Family came to bedside but appeared to agitate patient.  I asked family to wait in waiting room to allow [place] to monitor/observe patient.   7:13 AM Pt now  becoming more somnolent and Resp rate is decreasing.  I will order another dose of narcan.  I will also recheck apap/ASA levels.  If he does not have adequate response to this, he may need to be admitted for possible narcan drip.  I d/w dr Jodelle Gross knapp who will continue to manage patient in the ED  MDM  Nursing notes including past medical history and social history reviewed and considered in documentation Labs/vital reviewed and considered Discussed case with EMS xrays reviewed and considered    Date: 07/13/2012  Rate: 89  Rhythm: normal sinus rhythm  QRS Axis: normal  Intervals: normal  ST/T Wave abnormalities: normal  Conduction Disutrbances:none  Narrative Interpretation:   Old EKG Reviewed: none available at time of interpretation       Joya Gaskins, MD 07/13/12 [phone]

## 2012-07-13 NOTE — ED Notes (Addendum)
Due to patient's sedative nature, monitor and cords were left so patient could be monitored.  Per nurse's request

## 2012-07-13 NOTE — ED Notes (Addendum)
Pt wants to know what happened to his leg last night, states that he does not recall.  States that he does not use cocaine at all, his drug results must have been a false positive.  States that he has been using methadone for years, denies overdose at present.

## 2012-07-13 NOTE — ED Notes (Signed)
Pt is questioning why he is being admitted to the hospital, being argumentative with staff on medication that he is receiving.  Pt states that he has not seen anyone and states that he did not say anything regarding being suicidal.  States that he wants to get dressed and leave.  States that he wants to see proof of the IVC document.  Security Vikki Ports) at bedside.  Pt sliding to the end of the bed and pulling curtain closed.  Pt states that he nurses and doctors are lying.

## 2012-07-13 NOTE — ED Provider Notes (Signed)
Pt just received narcan x 2nd dose, nurse reports his RR was down to 6-8, now 15, pt arousable, still has slow thick slurred speech. Denies taking a lot of pills.   Will observe.   Repeat salicylate level is dropping, repeat acetaminophen level remains <15.   08: 40 Nurse has talked to [employer] and they recommend 24 hour admission b/o delayed absortion of the methadone. They report if he needs one more dose of narcan he should be started on a narcan drip at 2/3 the dose we have given him hourly.   09:15 Dr Lendell Caprice, hospitalist, admit to ICU.  09:16 Pt's BP dropped to 89/55 he was given a bolus of NS, his respiratory rate dropped to 9 and he was given his 3rd dose of narcan 0.4 mg and started on narcan drip.   09:45 Pt attempted to leave the ED, IVC papers signed by me   Final diagnoses:  Polysubstance abuse  Laceration of right lower extremity, initial encounter  Narcotic overdose, initial encounter  Hypotension  Somnolence   . Plan admission  CRITICAL CARE Performed by: Devoria Albe L   Total critical care time: 32 min  Critical care time was exclusive of separately billable procedures and treating other patients.  Critical care was necessary to treat or prevent imminent or life-threatening deterioration.  Critical care was time spent personally by me on the following activities: development of treatment plan with patient and/or surrogate as well as nursing, discussions with consultants, evaluation of patient's response to treatment, examination of patient, obtaining history from patient or surrogate, ordering and performing treatments and interventions, ordering and review of laboratory studies, ordering and review of radiographic studies, pulse oximetry and re-evaluation of patient's condition.     Ward Givens, MD 07/13/12 1010

## 2012-07-13 NOTE — ED Notes (Addendum)
Pt upset, does not understand why he is in the hospital.  The patients medical situation explained in detail to him, however, the pt is argumentative, the patient states that he wants to sign out and leave.  MD - Lynelle Doctor, Iva made aware of situation and determination for involuntary hold was made.  Security at bedside and [employer] notified of situation.  The patient is upset with nursing because he can not have anything to drink currently or get out of bed when he wants to.

## 2012-07-13 NOTE — Progress Notes (Signed)
Smoker, asking for nicotine patch   Nicotine patch ordered.

## 2012-07-13 NOTE — ED Notes (Signed)
Pt sat up in bed and started to pull off his leads and oxygen, pt reoriented to situation.

## 2012-07-13 NOTE — Progress Notes (Signed)
Pt argumentative with staff upon arrival to floor, requesting to leave, questioning validity of involuntary commitment papers. I firmly, but politely, told patient that the papers were legally binding, and could not be withdraw until he was evaluated by the ACT team, and he will remain on suicide precautions until he is deemed to be safe. Pt was displeased with answer, but became more cooperative. Pt was assured that we would meet his needs to the best of our ability, while assuring his safety. Pt currently sleeping at this time.

## 2012-07-13 NOTE — ED Notes (Signed)
Pt's mothers meds were given back to the mother, Kamir Selover.

## 2012-07-13 NOTE — ED Notes (Signed)
Patient awake currently with sheets over face, removing oxygen from face, restless in bed at present, continues to be rude with nursing staff.

## 2012-07-13 NOTE — ED Notes (Signed)
Pt has not voided this shift.

## 2012-07-13 NOTE — ED Notes (Signed)
Pt has a laceration to rt shin approx 3 inches long and 2 inches wide, pt had mothers medication in his possession, Dilaudid and Methadone with many pills missing.

## 2012-07-13 NOTE — ED Notes (Signed)
Pt was stating he was going to leave. Pt informed that he could die due to the overdose of methadone.He then stated, "What is the use in living when I am in all this pain. I have no life like this." Pt then stated  Pt denies taking methadone but has received Narcan multiple times while in the ER and many methadone tablets were missing. EDP singned IVC papers. [employer] at bedside.

## 2012-07-13 NOTE — ED Notes (Signed)
Pt given 0.4mg narcan.

## 2012-07-13 NOTE — ED Notes (Addendum)
Pt has lac to lower rt shin, 3 inches x 2 inches, bleeding controlled, pt appears obtunded, per pt's mother she is missing many pills and thinks her son has been taking them.  Missing approx 30 4mg  dilaudid tablets and 80-100 10mg  Methadone tablets.

## 2012-07-13 NOTE — ED Notes (Signed)
Resting quietly at present, sitter at bedside.  No urine output this shift.

## 2012-07-13 NOTE — ED Notes (Signed)
Mother wants to be notified when pt is discharged. Consuella Lose & Antonie Borjon [phone]-pts mother and father.

## 2012-07-13 NOTE — ED Notes (Signed)
Wrong pt entered, dismiss not an option, pt roomed

## 2012-07-14 DIAGNOSIS — F489 Nonpsychotic mental disorder, unspecified: Secondary | ICD-10-CM

## 2012-07-14 DIAGNOSIS — Z7289 Other problems related to lifestyle: Secondary | ICD-10-CM | POA: Diagnosis present

## 2012-07-14 DIAGNOSIS — I959 Hypotension, unspecified: Secondary | ICD-10-CM

## 2012-07-14 LAB — CBC
MCV: 90.6 fL (ref 78.0–100.0)
Platelets: 163 10*3/uL (ref 150–400)
RBC: 4.35 MIL/uL (ref 4.22–5.81)
RDW: 12.5 % (ref 11.5–15.5)
WBC: 9.1 10*3/uL (ref 4.0–10.5)

## 2012-07-14 LAB — COMPREHENSIVE METABOLIC PANEL
Albumin: 3.4 g/dL — ABNORMAL LOW (ref 3.5–5.2)
Alkaline Phosphatase: 69 U/L (ref 39–117)
BUN: 13 mg/dL (ref 6–23)
Chloride: 106 mEq/L (ref 96–112)
Creatinine, Ser: 1.08 mg/dL (ref 0.50–1.35)
GFR calc Af Amer: >90 mL/min (ref >90)
Glucose, Bld: 86 mg/dL (ref 70–99)
Total Bilirubin: 0.2 mg/dL — ABNORMAL LOW (ref 0.3–1.2)

## 2012-07-14 MED ORDER — LORAZEPAM 0.5 MG PO TABS: 1.0000 mg | ORAL_TABLET | ORAL | Status: DC | PRN

## 2012-07-14 MED ORDER — VITAMIN B-1 100 MG PO TABS
100.0000 mg | ORAL_TABLET | Freq: Every day | ORAL | Status: DC
  Administered 2012-07-14: 100 mg via ORAL
  Filled 2012-07-14: qty 1

## 2012-07-14 MED ORDER — ACETAMINOPHEN 500 MG PO TABS
1000.0000 mg | ORAL_TABLET | Freq: Once | ORAL | Status: AC
  Administered 2012-07-14: 1000 mg via ORAL
  Filled 2012-07-14: qty 2

## 2012-07-14 NOTE — Progress Notes (Signed)
Narcan gtt discontinued per Dr. Pincus Sanes request.

## 2012-07-14 NOTE — Progress Notes (Signed)
eLink Physician-Brief Progress Note Patient Name: LINKEN MCGLOTHEN DOB: 09-Feb-1976 MRN: [id number]  Date of Service  07/14/2012   HPI/Events of Note   headache  eICU Interventions  tylenol   Intervention Category Minor Interventions: Other:  Skylinn Vialpando 07/14/2012, 6:17 AM

## 2012-07-14 NOTE — Progress Notes (Addendum)
Discharge papers given to patient with no questions. F/u appointment made by ACT team through [facility] for [facility]. Explained to patient to call [facility] for any change in appointment. Pt discharged in stable condition. Waiting for ride.

## 2012-07-14 NOTE — Progress Notes (Signed)
Patient ambulated out of facility by staff to get to ride.

## 2012-07-14 NOTE — ED Provider Notes (Signed)
Please see MRN [id number] for his ED note and full H&P  Joya Gaskins, MD 07/14/12 [phone]

## 2012-07-14 NOTE — Progress Notes (Signed)
UR Chart Review Completed  

## 2012-07-14 NOTE — Clinical Social Work Psychosocial (Signed)
    Clinical Social Work Department BRIEF PSYCHOSOCIAL ASSESSMENT 07/14/2012  Patient:  Nathan Mckay, Nathan Mckay     Account Number:  [id number]     Admit date:  07/13/2012  Clinical Social Worker:  Santa Genera, CLINICAL SOCIAL WORKER  Date/Time:  07/14/2012 10:30 AM  Referred by:  Physician  Date Referred:  07/14/2012 Referred for  Substance Abuse   Other Referral:   Interview type:  Patient Other interview type:    PSYCHOSOCIAL DATA Living Status:  FAMILY Admitted from facility:   Level of care:   Primary support name:  Orpah Clinton Primary support relationship to patient:  FRIEND Degree of support available:   Limited, friend has health issues and family members are withdrawing support from patient due to substance use issues    CURRENT CONCERNS Current Concerns  Substance Abuse   Other Concerns:    SOCIAL WORK ASSESSMENT / PLAN CSW met  w patient at bedside, patient alert and oriented. Patient admitted due to methadone overdose.  Patient has long history of optiate use, initially prescribed to control pain from a back injury.  Patient was participating in treatment w [facility], including individual tx, group tx and prescription of methadone (was on 40 mg methadone which controlled his pain).  When providers moved, patient was referred to [facility], MD who continued to prescribe methadone until patient failed drug test (positive for cocaine) in approx 2012.    Patient states that he now obtains methadone on the street, says he needs 20 mg/day to control withdrawal sx, 40 mg to provide pain relief.  Is usually able to obtain 20 mg/day and reports that he continues to suffer significant pain sx.  Sometimes buys Xanax on the street (2 - 3 mg several times/week) which patient states allows him to "forget about his pain" temporarily.  States he "despises" drinking, cannabis and has "occasionally used cocaine" which was "stupid."  Says used cocaine impulsively because it was  readily available on the street.    Patient lives w girlfriend, this AM patient was very concerned because could not wake girlfriend up by phone. CSW and RN attempted to problem solve w patient, eventually resolved issue through family calling EMS to check on girlfriend.    Patient states that he wants help w finding treatment for life issues, wants to get methadone prescribed from legitimate provider, also knows he needs to regain his credibility w family and treatment providers.  CSW will communicate w ACT team and MD and assist w possible discharge disposition options for residential substance use treatment.   Assessment/plan status:  Psychosocial Support/Ongoing Assessment of Needs Other assessment/ plan:   Information/referral to community resources:   Will assist ACT team w SA referrals    PATIENT'S/FAMILY'S RESPONSE TO PLAN OF CARE: Patient appreciative and states he wants help addressing his substance use issues, which he acknowledges are "out of control."    Santa Genera, LCSW Clinical Social Worker [phone])

## 2012-07-14 NOTE — Clinical Social Work Note (Signed)
CSW spoke w ACT team worker and discussed patient desire for residential SA treatment and wish to remain on methadone.  ACT team worker will present treatment options to patient, which are thought to include [facility] for residential SA treatment but may not provide methadone.  Alternatives to be presented include referral for hospital discharge appointment at [facility] for substance use treatment and possible referral to pain clinic for pain management. CSW will monitor as ACT team determines disposition at discharge.   Santa Genera, LCSW Clinical Social Worker [phone])

## 2012-07-14 NOTE — BH Assessment (Addendum)
Assessment Note   Nathan Mckay is an 37 y.o. male.   Brought to the hospital after taking enough methodone and alcohol to require narcan to revive him. Currently in the ICU where he is medically cleared and ready for discharge. Denies SI/HI; is not psychotic or delusional. States that he absolutely was not trying to kill himself, that he was partying and mixed etoh (6 beers) with methodone and required resusitation with narcan. His girlfriend is also in the ICU from the same thing on the same night. Pt has been using opiates since his early 65s because of chronic back pain. Mostly he purchases the methodone on the street and when he cannot get that he gets Xanax taking 3 or 4 mg about 3 times a week which helps him cope with the pain better. He also uses cocaine occasionally and drinks alcohol only occasionally. 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 Will submit to [facility] for treatment of methodone and polysubstance addiction. 3:16 PM [facility] refused pt because he has h/o seizures; Pt will need medical management and discharged when medically cleared. Appointment made for [facility] in [place] [phone] on Monday 07/18/2012. Sponsorship obtained from [facility]: Referral # [id number].  Axis I: Substance Abuse Axis II: Deferred Axis III:  Past Medical History  Diagnosis Date  . Seizures   . Chronic headaches   . Chronic back pain   . Polysubstance abuse   . Recurrent shoulder dislocation   . Sciatica    Axis IV: economic problems, occupational problems, other psychosocial or environmental problems, problems related to social environment and problems with access to health care services Axis V: 41-50 serious symptoms  Past Medical History:  Past Medical History  Diagnosis Date  . Seizures   . Chronic headaches   . Chronic back pain   . Polysubstance abuse   . Recurrent shoulder dislocation   .  Sciatica     Past Surgical History  Procedure Laterality Date  . Hand surgery      Family History: History reviewed. No pertinent family history.  Social History:  reports that he has been smoking Cigarettes.  He has been smoking about 0.00 packs per day. He does not have any smokeless tobacco history on file. He reports that he does not drink alcohol or use illicit drugs.  Additional Social History:  Alcohol / Drug Use Pain Medications: methodone History of alcohol / drug use?: Yes Negative Consequences of Use: Financial;Personal relationships Withdrawal Symptoms: Agitation Substance #1 Name of Substance 1: Opiate 1 - Age of First Use: 23 1 - Amount (size/oz): 20 to 40 mg of Methodone a day when he can get it off the street 1 - Frequency: daily 1 - Duration: for at least 10 years 1 - Last Use / Amount: April 1 - does not remember Substance #2 Name of Substance 2: Benzodiazipine 2 - Age of First Use: 65 2 - Amount (size/oz): 3 or 4 mg of Xanax or Valium, 2 - Frequency: three times a week 2 - Duration: 8 years 2 - Last Use / Amount: 07/12/2012 unknown amount Substance #3 Name of Substance 3: ETOH 3 - Age of First Use: 12 3 - Amount (size/oz): rarely drinks 3 - Last Use / Amount: told that he drank 6 beers on 07/12/2012  CIWA: CIWA-Ar BP: 103/88 mmHg Pulse Rate: 89 Nausea and Vomiting: no nausea and no vomiting Tactile Disturbances: none Tremor:  not visible, but can be felt fingertip to fingertip Auditory Disturbances: not present Paroxysmal Sweats: barely perceptible sweating, palms moist Visual Disturbances: not present Headache, Fullness in Head: very mild Agitation: somewhat more than normal activity Orientation and Clouding of Sensorium: oriented and can do serial additions COWS: Clinical Opiate Withdrawal Scale (COWS) Resting Pulse Rate: Pulse Rate 80 or below Sweating: No report of chills or flushing Restlessness: Reports difficulty sitting still, but is able to  do so Pupil Size: Pupils pinned or normal size for room light Bone or Joint Aches: Mild diffuse discomfort Runny Nose or Tearing: Not present GI Upset: No GI symptoms Tremor: Slight tremor observable Yawning: No yawning Anxiety or Irritability: Patient reports increasing irritability or anxiousness Gooseflesh Skin: Skin is smooth COWS Total Score: 5  Allergies:  Allergies  Allergen Reactions  . Nickel Rash    Home Medications:  Medications Prior to Admission  Medication Sig Dispense Refill  . acetaminophen (TYLENOL) 325 MG tablet Take 975 mg by mouth every 6 (six) hours as needed. pain        OB/GYN Status:  No LMP for male patient.  General Assessment Data Location of Assessment:  ([facility] ICU) ACT Assessment: Yes Living Arrangements: Non-relatives/Friends Can pt return to current living arrangement?: Yes Admission Status: Voluntary Is patient capable of signing voluntary admission?: Yes Transfer from: Home Referral Source: MD  Education Status Is patient currently in school?: No  Risk to self Suicidal Ideation: No Suicidal Intent: No Is patient at risk for suicide?: Yes Suicidal Plan?: No Access to Means: Yes Specify Access to Suicidal Means: addiction to drugs What has been your use of drugs/alcohol within the last 12 months?: methodone; benzodiazpines; cocaine; ETOH Previous Attempts/Gestures: No Intentional Self Injurious Behavior: None Family Suicide History: No Recent stressful life event(s): Other (Comment) (chronic pain) Persecutory voices/beliefs?: No Depression: Yes Depression Symptoms: Guilt;Feeling worthless/self pity Substance abuse history and/or treatment for substance abuse?: Yes Suicide prevention information given to non-admitted patients: Not applicable  Risk to Others Homicidal Ideation: No Thoughts of Harm to Others: No Current Homicidal Intent: No Current Homicidal Plan: No Access to Homicidal Means: No History of harm to others?:  No Does patient have access to weapons?: No Criminal Charges Pending?: No Does patient have a court date: No  Psychosis Hallucinations: None noted Delusions: None noted  Mental Status Report Appear/Hygiene: Disheveled Eye Contact: Good Motor Activity: Freedom of movement Speech: Tangential Level of Consciousness: Alert Mood: Depressed;Anxious Affect: Blunted Anxiety Level: Minimal Thought Processes: Tangential Judgement: Impaired Orientation: Person;Place;Time;Situation Obsessive Compulsive Thoughts/Behaviors: None  Cognitive Functioning Concentration: Decreased Memory: Recent Intact;Remote Intact IQ: Average Insight: Fair Impulse Control: Poor Appetite: Good Sleep: No Change Total Hours of Sleep: 4 Vegetative Symptoms: None  ADLScreening [facility] Assessment Services) Patient's cognitive ability adequate to safely complete daily activities?: Yes Patient able to express need for assistance with ADLs?: Yes Independently performs ADLs?: Yes (appropriate for developmental age)  Abuse/Neglect [facility]) Physical Abuse: Denies Verbal Abuse: Denies Sexual Abuse: Denies  Prior Inpatient Therapy Prior Inpatient Therapy: No  Prior Outpatient Therapy Prior Outpatient Therapy: No  ADL Screening (condition at time of admission) Patient's cognitive ability adequate to safely complete daily activities?: Yes Patient able to express need for assistance with ADLs?: Yes Independently performs ADLs?: Yes (appropriate for developmental age) Weakness of Legs: None Weakness of Arms/Hands: None  Home Assistive Devices/Equipment Home Assistive Devices/Equipment: None  Therapy Consults (therapy consults require a physician order) PT Evaluation Needed: No OT Evalulation Needed: No SLP Evaluation Needed: No Abuse/Neglect  Assessment (Assessment to be complete while patient is alone) Physical Abuse: Denies Verbal Abuse: Denies Sexual Abuse: Denies Exploitation of patient/patient's  resources: Denies Self-Neglect: Denies Values / Beliefs Cultural Requests During Hospitalization: None Spiritual Requests During Hospitalization: None Consults Spiritual Care Consult Needed: No Social Work Consult Needed: No Merchant navy officer (For Healthcare) Advance Directive: Patient does not have advance directive Pre-existing out of facility DNR order (yellow form or pink MOST form): No Nutrition Screen- [facility] Adult/[facility] Patient's home diet: Regular Have you recently lost weight without trying?: No Have you been eating poorly because of a decreased appetite?: No Malnutrition Screening Tool Score: 0  Additional Information 1:1 In Past 12 Months?: No CIRT Risk: No Elopement Risk: No Does patient have medical clearance?: Yes     Disposition:  Disposition Initial Assessment Completed for this Encounter: Yes Disposition of Patient: Inpatient treatment program Type of inpatient treatment program: Adult  On Site Evaluation by:   Reviewed with Physician:     Genia Del 07/14/2012 2:59 PM

## 2012-07-14 NOTE — Progress Notes (Signed)
[facility] PROGRESS NOTE  ESDRAS DELAIR WUJ:[id number] DOB: 08-06-1975 DOA: 07/13/2012 PCP: No PCP Per Patient  Assessment/Plan: Benzodiazepine and opiate Overdose with alcohol and cocaine abuse, resulting in cardiopulmonary depression: Pt awake, alert and calm/cooperative this am. Will discontinue narcan drip and continue to observe for cardiopulmonary depression in the ICU.    Toxic encephalopathy: related to #1. Resolved. Currently calm, cooperative. Oriented x3. Has little memory of events of yesterday.  IVC papers expired as pt now has capacity for informed consent.    Hypotension: resolved. SBP range 99-115   Polysubstance abuse: see #1 and #2. Pt denies intentional overdosing. Denies suicide ideation. Will discontinue Recruitment consultant.  Will request ACT team consult. Pt verbalizes desire for help. May benefit from inpatient treatment given hx.   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 2 scars on right lateral leg. States cutting self helps him cope with his chronic pain. Will request ACT consult.   Right leg laceration, status post suture repair by ED physician : Reportedly result from fall pre admission. Concern may have been self inflicted.  No erythema or swelling.   Chronic back pain: stable at baseline  Code Status: full Family Communication:  Disposition Plan: Home when stable. May benefit inpatient rehab.    Consultants:  ACT  Procedures:  none  Antibiotics:  none  HPI/Subjective: Sitting up eating breakfast. Reports no memory of yesterdays event.   Objective: Filed Vitals:   07/14/12 0448 07/14/12 0500 07/14/12 0600 07/14/12 0700  BP: 99/59 101/64 105/72 114/78  Pulse: 73 71 64 73  Temp: 98.3 F (36.8 C)     TempSrc: Oral     Resp: 18 18 20 18   Height:      Weight:      SpO2: 96% 97% 97% 97%    Intake/Output Summary (Last 24 hours) at 07/14/12 0841 Last data filed at 07/14/12 0835  Gross per 24 hour  Intake 6347.25 ml  Output    2700 ml  Net 3647.25 ml   Filed Weights   07/13/12 0338  Weight: 85.276 kg (188 lb)    Exam:   General:  Well nourished, calm cooperative  Cardiovascular: RRR No MGR No LE edema  Respiratory: Normal effort BS clear bilaterally no wheeze   Abdomen: flat soft non-tender to palpation  Musculoskeletal: moves all extremities. Laceration to lateral aspect right leg mid calf with staples that are clean and dry.    Data Reviewed: Basic Metabolic Panel:  Recent Labs Lab 07/13/12 0345 07/14/12 0500  NA 139 139  K 4.2 3.7  CL 103 106  CO2 23 26  GLUCOSE 112* 86  BUN 13 13  CREATININE 1.19 1.08  CALCIUM 9.4 8.7   Liver Function Tests:  Recent Labs Lab 07/13/12 0345 07/14/12 0500  AST 24 27  ALT 27 23  ALKPHOS 79 69  BILITOT 0.2* 0.2*  PROT 7.7 5.8*  ALBUMIN 4.6 3.4*   No results found for this basename: LIPASE, AMYLASE,  in the last 168 hours No results found for this basename: AMMONIA,  in the last 168 hours CBC:  Recent Labs Lab 07/13/12 0345 07/14/12 0500  WBC 11.1* 9.1  NEUTROABS 6.5  --   HGB 15.4 13.3  HCT 44.1 39.4  MCV 88.2 90.6  PLT 237 163   Cardiac Enzymes: No results found for this basename: CKTOTAL, CKMB, CKMBINDEX, TROPONINI,  in the last 168 hours BNP (last 3 results) No results found for this basename: PROBNP,  in the last 8760  hours CBG:  Recent Labs Lab 07/13/12 0437  GLUCAP 109*    Recent Results (from the past 240 hour(s))  MRSA PCR SCREENING     Status: None   Collection Time    07/14/12  4:46 AM      Result Value Range Status   MRSA by PCR NEGATIVE  NEGATIVE Final   Comment:            The GeneXpert MRSA Assay (FDA     approved for NASAL specimens     only), is one component of a     comprehensive MRSA colonization     surveillance program. It is not     intended to diagnose MRSA     infection nor to guide or     monitor treatment for     MRSA infections.     Studies: Dg Tibia/fibula Right  07/13/2012   *RADIOLOGY REPORT*  Clinical Data: Laceration to the mid shaft lateral side of the right lower leg.  RIGHT TIBIA AND FIBULA - 2 VIEW  Comparison: None.  Findings: The right tibia and fibula appear intact.  No evidence of acute fracture or subluxation.  No focal bone lesion or bone destruction.  There is fragmentation of the tibial tubercle with overlying soft tissue swelling suggesting Osgood-Schlatter change. No radiopaque soft tissue foreign bodies are demonstrated.  IMPRESSION: No acute bony abnormalities.  No radiopaque soft tissue foreign bodies.   Original Report Authenticated By: Burman Nieves, M.D.    Ct Head Wo Contrast  07/13/2012  *RADIOLOGY REPORT*  Clinical Data: Altered mental status.  History of seizures.  CT HEAD WITHOUT CONTRAST  Technique:  Contiguous axial images were obtained from the base of the skull through the vertex without contrast.  Comparison: 12/16/2011  Findings: The ventricles and sulci are symmetrical without significant effacement, displacement, or dilatation. No mass effect or midline shift. No abnormal extra-axial fluid collections. The grey-white matter junction is distinct. Basal cisterns are not effaced. No acute intracranial hemorrhage. No depressed skull fractures.  Visualized paranasal sinuses and mastoid air cells are not opacified.  No significant changes since the previous study.  IMPRESSION: No acute intracranial abnormalities.   Original Report Authenticated By: Burman Nieves, M.D.     Scheduled Meds: . enoxaparin (LOVENOX) injection  40 mg Subcutaneous Q24H  . nicotine  21 mg Transdermal Daily  . thiamine  100 mg Oral Daily   Continuous Infusions:   Principal Problem:   Methadone overdose Active Problems:   Toxic encephalopathy   Hypotension   Polysubstance abuse   Self-mutilation    Time spent: 30 minutes    [facility] M  [facility]  If 7PM-7AM, please contact night-coverage at [email or web address], password [facility] 07/14/2012, 8:41 AM   LOS: 1 day   Pateint interviewed and examined.  See discharge summary.  Crista Curb, M.D.

## 2012-07-15 NOTE — Discharge Summary (Signed)
Physician Discharge Summary  [name]:[id number] DOB: 1975/07/30 DOA: 07/13/2012  PCP: No PCP Per Patient  Admit date: 07/13/2012 Discharge date: 07/15/2012  Time spent: 40 minutes  Recommendations for Outpatient Follow-up:  1. Pt has appointment with [facility] 07/18/12 for substance use therapy.  Discharge Diagnoses:  Principal Problem:   Methadone overdose Active Problems:   Toxic encephalopathy   Hypotension   Polysubstance abuse   Self-mutilation  laceration of the leg. Requiring staples  Discharge Condition: stable  Diet recommendation: regular  Filed Weights   07/13/12 0338  Weight: 85.276 kg (188 lb)    History of present illness:  Nathan Mckay is a 37 y.o. male with pmhx polysubstance abuse, headaches, seizures, chronic back pain who presented to ED on 07/13/12 with cc altered mental status and drug overdose. Information obtained from mother via phone as pt altered and combative. Mother reported pt was "fired" from pain clinic several months ago and since then had been "doing whatever he can to get drugs". She reported the evening prior to presentation around midnight he took methadone, dilaudid and xanax from her. She was uncertain of how much of each. She also reported he was drinking ETOH. She stated the pt fell sometime during the night . This am of admission he was difficult to arouse and had laceration to right LE so she called 911, In ED urine drug screen + for benzo and cocaine. In addition pt somnolent required 3 doses of narcan and ultimately a narcan drip. In addition SBP dropped to 80's and he was given IV fluid bolus. Poison control consulted who advised admission, narcan drip and monitoring of BP as pressors may be necessary. TRH asked to admit   Hospital Course:  Benzodiazepine and opiate Overdose with alcohol and cocaine abuse, resulting in cardiopulmonary depression: Pt admitted to ICU on narcan drip given methadone's long half life. He was also  supported with IV fluids and thiamin as his ETOH habits were unknown. Pt did require ativan and haldol in ED for severe agitation and IVC papers initiated in ED.Pt responded positively to treatment and by morning of discharge was awake, alert and calm/cooperative. Narcan drip was discontinued and pt observed for several more hours to ensure stability. He denied having tried to intentionally overdosed. Denied suicidal ideation. At discharge pt alert, oriented x3 with stable VS.   Toxic encephalopathy: related to #1. Resolved by morning of discharge. Had little memory of events.. IVC papers expired as pt has capacity for informed consent.   Hypotension: resolved with IV fluids. SBP range 99-115   Polysubstance abuse: see #1 and #2. Pt denied intentional overdosing. Denied suicide ideation. ACT team consult obtained as well as SW consult. Pt given resources available for treatment for substance use and chronic pain. Pt verbalized desire for help. Pt has appointment with [facility] on 07/18/12   Self mutilation: pt reported history of "cutting". 2 healed scars on right lateral leg and multiple on his arms. Stated cutting self helps him cope with his chronic pain. ACT and SW consults. Pt has appointment with [facility] 07/18/12   Right leg laceration, status post suture repair by ED physician : Reportedly result from fall pre admission. Concern may have been self inflicted. No erythema or swelling. Staples dry and intact at discharge  Chronic back pain: stable at baseline   Procedures: staples to right leg laceration Consultations:  ACT team  Discharge Exam: Filed Vitals:   07/14/12 1330 07/14/12 1400 07/14/12 1426 07/14/12 1430  BP:  96/57 109/76 114/70 103/88  Pulse: 80 86  89  Temp:      TempSrc:      Resp: 13 15  28   Height:      Weight:      SpO2: 93% 99%  98%    General: awake alert NAD cooperative Cardiovascular: RRR No MGR No LE edema Respiratory: normal effort BS clear bilaterally no  wheeze/rhonchi Musculoskeletal: ambulates with steady gait   Discharge Instructions  Discharge Orders   Future Orders Complete By Expires     Diet general  As directed     Discharge instructions  As directed         Medication List    TAKE these medications       acetaminophen 325 MG tablet  Commonly known as:  TYLENOL  Take 975 mg by mouth every 6 (six) hours as needed. pain           Follow-up Information   Follow up with [facility] On 07/18/2012. (At 7:45AM. Please call [facility] if unable to keep appointment at [phone] )    Contact information:   [phone]       The results of significant diagnostics from this hospitalization (including imaging, microbiology, ancillary and laboratory) are listed below for reference.    Significant Diagnostic Studies: Dg Tibia/fibula Right  07/13/2012  *RADIOLOGY REPORT*  Clinical Data: Laceration to the mid shaft lateral side of the right lower leg.  RIGHT TIBIA AND FIBULA - 2 VIEW  Comparison: None.  Findings: The right tibia and fibula appear intact.  No evidence of acute fracture or subluxation.  No focal bone lesion or bone destruction.  There is fragmentation of the tibial tubercle with overlying soft tissue swelling suggesting Osgood-Schlatter change. No radiopaque soft tissue foreign bodies are demonstrated.  IMPRESSION: No acute bony abnormalities.  No radiopaque soft tissue foreign bodies.   Original Report Authenticated By: Burman Nieves, M.D.    Ct Head Wo Contrast  07/13/2012  *RADIOLOGY REPORT*  Clinical Data: Altered mental status.  History of seizures.  CT HEAD WITHOUT CONTRAST  Technique:  Contiguous axial images were obtained from the base of the skull through the vertex without contrast.  Comparison: 12/16/2011  Findings: The ventricles and sulci are symmetrical without significant effacement, displacement, or dilatation. No mass effect or midline shift. No abnormal extra-axial fluid collections. The grey-white  matter junction is distinct. Basal cisterns are not effaced. No acute intracranial hemorrhage. No depressed skull fractures.  Visualized paranasal sinuses and mastoid air cells are not opacified.  No significant changes since the previous study.  IMPRESSION: No acute intracranial abnormalities.   Original Report Authenticated By: Burman Nieves, M.D.     Microbiology: Recent Results (from the past 240 hour(s))  MRSA PCR SCREENING     Status: None   Collection Time    07/14/12  4:46 AM      Result Value Range Status   MRSA by PCR NEGATIVE  NEGATIVE Final   Comment:            The GeneXpert MRSA Assay (FDA     approved for NASAL specimens     only), is one component of a     comprehensive MRSA colonization     surveillance program. It is not     intended to diagnose MRSA     infection nor to guide or     monitor treatment for     MRSA infections.     Labs: Basic Metabolic  Panel:  Recent Labs Lab 07/13/12 0345 07/14/12 0500  NA 139 139  K 4.2 3.7  CL 103 106  CO2 23 26  GLUCOSE 112* 86  BUN 13 13  CREATININE 1.19 1.08  CALCIUM 9.4 8.7   Liver Function Tests:  Recent Labs Lab 07/13/12 0345 07/14/12 0500  AST 24 27  ALT 27 23  ALKPHOS 79 69  BILITOT 0.2* 0.2*  PROT 7.7 5.8*  ALBUMIN 4.6 3.4*   No results found for this basename: LIPASE, AMYLASE,  in the last 168 hours No results found for this basename: AMMONIA,  in the last 168 hours CBC:  Recent Labs Lab 07/13/12 0345 07/14/12 0500  WBC 11.1* 9.1  NEUTROABS 6.5  --   HGB 15.4 13.3  HCT 44.1 39.4  MCV 88.2 90.6  PLT 237 163   Cardiac Enzymes: No results found for this basename: CKTOTAL, CKMB, CKMBINDEX, TROPONINI,  in the last 168 hours BNP: BNP (last 3 results) No results found for this basename: PROBNP,  in the last 8760 hours CBG:  Recent Labs Lab 07/13/12 0437  GLUCAP 109*   Signed:  Toya Smothers M  [facility] 07/15/2012, 6:59 AM  Attending note  Patient interviewed and  examined. Agree with above.  Crista Curb, M.D.

## 2012-08-15 ENCOUNTER — Emergency Department (HOSPITAL_COMMUNITY): Payer: Self-pay

## 2012-08-15 ENCOUNTER — Emergency Department (HOSPITAL_COMMUNITY)
Admission: EM | Admit: 2012-08-15 | Discharge: 2012-08-15 | Discharge status: Against Medical Advice | Payer: Self-pay | Attending: Emergency Medicine | Admitting: Emergency Medicine

## 2012-08-15 ENCOUNTER — Encounter (HOSPITAL_COMMUNITY): Payer: Self-pay | Admitting: Emergency Medicine

## 2012-08-15 DIAGNOSIS — M542 Cervicalgia: Secondary | ICD-10-CM

## 2012-08-15 DIAGNOSIS — S8990XA Unspecified injury of unspecified lower leg, initial encounter: Secondary | ICD-10-CM | POA: Insufficient documentation

## 2012-08-15 DIAGNOSIS — S99929A Unspecified injury of unspecified foot, initial encounter: Secondary | ICD-10-CM | POA: Insufficient documentation

## 2012-08-15 DIAGNOSIS — S0993XA Unspecified injury of face, initial encounter: Secondary | ICD-10-CM | POA: Insufficient documentation

## 2012-08-15 DIAGNOSIS — Y9389 Activity, other specified: Secondary | ICD-10-CM | POA: Insufficient documentation

## 2012-08-15 DIAGNOSIS — IMO0002 Reserved for concepts with insufficient information to code with codable children: Secondary | ICD-10-CM | POA: Insufficient documentation

## 2012-08-15 DIAGNOSIS — W1809XA Striking against other object with subsequent fall, initial encounter: Secondary | ICD-10-CM | POA: Insufficient documentation

## 2012-08-15 DIAGNOSIS — Z765 Malingerer [conscious simulation]: Secondary | ICD-10-CM

## 2012-08-15 DIAGNOSIS — F1910 Other psychoactive substance abuse, uncomplicated: Secondary | ICD-10-CM | POA: Insufficient documentation

## 2012-08-15 DIAGNOSIS — Z8739 Personal history of other diseases of the musculoskeletal system and connective tissue: Secondary | ICD-10-CM | POA: Insufficient documentation

## 2012-08-15 DIAGNOSIS — F17200 Nicotine dependence, unspecified, uncomplicated: Secondary | ICD-10-CM | POA: Insufficient documentation

## 2012-08-15 DIAGNOSIS — Z8669 Personal history of other diseases of the nervous system and sense organs: Secondary | ICD-10-CM | POA: Insufficient documentation

## 2012-08-15 DIAGNOSIS — Y92009 Unspecified place in unspecified non-institutional (private) residence as the place of occurrence of the external cause: Secondary | ICD-10-CM | POA: Insufficient documentation

## 2012-08-15 HISTORY — DX: Fibromyalgia: M79.7

## 2012-08-15 MED ORDER — IBUPROFEN 400 MG PO TABS
400.0000 mg | ORAL_TABLET | Freq: Once | ORAL | Status: AC
  Administered 2012-08-15: 400 mg via ORAL
  Filled 2012-08-15: qty 1

## 2012-08-15 NOTE — ED Provider Notes (Signed)
History    This chart was scribed for [name], MD by [name], ED Scribe. This patient was seen in room APA19/APA19 and the patient's care was started 7:38 AM.   CSN: [id number]  Arrival date & time 08/15/12  0722   None     Chief Complaint  Patient presents with  . Fall     The history is provided by the patient. No language interpreter was used.    Nathan Mckay is a 37 y.o. male with a h/o chronic back pain who presents to the Emergency Department complaining of a fall last that occurred last night. Pt has states he fell face first onto a stone porch after slipping and now has neck pain, back pain, and bilateral leg pain. He rates the pain as 8/10 currently. He also complains of a chipped tooth. Pt denies LOC. He takes 8 tylenol for pain daily. Denies taking methadone currently, but used it about 1 month ago. He admits to taking Xanax recently. He denies weakness, numbness, tingling.    Pt is a current everyday smoker but denies alcohol use. Denies using street drugs. Past Medical History  Diagnosis Date  . Seizures   . Chronic headaches   . Chronic back pain   . Polysubstance abuse   . Recurrent shoulder dislocation   . Sciatica   . Fibromyalgia     Past Surgical History  Procedure Laterality Date  . Hand surgery      No family history on file.  History  Substance Use Topics  . Smoking status: Current Every Day Smoker    Types: Cigarettes  . Smokeless tobacco: Not on file  . Alcohol Use: No      Review of Systems  HENT: Positive for neck pain.   Musculoskeletal: Positive for back pain.  Neurological: Negative for syncope, weakness and numbness.  All other systems reviewed and are negative.    Allergies  Nickel  Home Medications   Current Outpatient Rx  Name  Route  Sig  Dispense  Refill  . acetaminophen (TYLENOL) 325 MG tablet   Oral   Take 975 mg by mouth every 6 (six) hours as needed. pain           BP 117/59  Pulse 84   Temp(Src) 98.5 F (36.9 C)  Resp 18  Ht 5\' 9"  (1.753 m)  Wt 188 lb (85.276 kg)  BMI 27.75 kg/m2  SpO2 94%  Physical Exam  Nursing note and vitals reviewed. Constitutional: He is oriented to person, place, and time. He appears well-developed and well-nourished. No distress.  HENT:  Head: Normocephalic and atraumatic.  Eyes: EOM are normal.  Neck: Neck supple. No tracheal deviation present.  Stiff c-collar in place. Moderate midline tenderness.   Cardiovascular: Normal rate.   Pulmonary/Chest: Effort normal. No respiratory distress.  Musculoskeletal: Normal range of motion.  Neurological: He is alert and oriented to person, place, and time.  Drowsy but arousable. Speech is slow and slurred.   Skin: Skin is warm and dry.  Psychiatric: He has a normal mood and affect. His behavior is normal.    ED Course  Procedures (including critical care time)  DIAGNOSTIC STUDIES: Oxygen Saturation is 94% on room air, normal by my interpretation.    COORDINATION OF CARE: 8:01 AM Discussed treatment plan with pt at bedside and pt agreed to plan.   8:21 AM Pt refuses urine drug screen. He is warned of risk of paralysis and says he still wants to  leave.     1. Fall at home, initial encounter   2. Neck pain   3. Drug-seeking behavior       MDM  Fall with complaints of neck and back pain as well as facial injury. He is very drowsy and appears either intoxicated or under influence of medication although he denies this. He had been admitted to the hospital one month ago for a methadone overdose so I have asked him to give a urine sample for drug screen prior to considering any medication. Following this, patient took off his collar and stated that he wished to leave AGAINST MEDICAL ADVICE. He was advised of the risk of paralysis of identified cervical spine injury and he stated he was willing to take his chances and he left AGAINST MEDICAL ADVICE.    I personally performed the services  described in this documentation, which was scribed in my presence. The recorded information has been reviewed and is accurate.     [name], MD 08/15/12 [phone]

## 2012-08-15 NOTE — ED Notes (Signed)
Pt states that he is "hallucinating". He says that he thinks that he is hallucinating because he is a "night owl" and "not used to being up now". Pt denies hearing or seeing anything out of the ordinary.

## 2012-08-15 NOTE — ED Notes (Signed)
Pt staggering around in room,rambling through cabinets. When nurse ask pt was there something in the cabinets he needed he said," yea to get out of here "

## 2012-08-15 NOTE — ED Notes (Signed)
Pt states that he wants to "sign himself out of here because there is no reason for [him] to be here".

## 2012-08-15 NOTE — ED Notes (Addendum)
Pt c/o generalized pain from fall last night. Pt states he fell face first onto stone porch from a seated position. Pt c/o neck/back and bilateral leg pain. C-Collar placed. nad noted.

## 2012-09-11 ENCOUNTER — Encounter (HOSPITAL_COMMUNITY): Payer: Self-pay | Admitting: *Deleted

## 2012-09-11 ENCOUNTER — Emergency Department (HOSPITAL_COMMUNITY)
Admission: EM | Admit: 2012-09-11 | Discharge: 2012-09-12 | Disposition: A | Discharge status: Home / Self Care | Payer: Self-pay | Attending: Emergency Medicine | Admitting: Emergency Medicine

## 2012-09-11 DIAGNOSIS — G629 Polyneuropathy, unspecified: Secondary | ICD-10-CM

## 2012-09-11 DIAGNOSIS — G8929 Other chronic pain: Secondary | ICD-10-CM | POA: Insufficient documentation

## 2012-09-11 DIAGNOSIS — Z8739 Personal history of other diseases of the musculoskeletal system and connective tissue: Secondary | ICD-10-CM | POA: Insufficient documentation

## 2012-09-11 DIAGNOSIS — M549 Dorsalgia, unspecified: Secondary | ICD-10-CM | POA: Insufficient documentation

## 2012-09-11 DIAGNOSIS — R209 Unspecified disturbances of skin sensation: Secondary | ICD-10-CM | POA: Insufficient documentation

## 2012-09-11 DIAGNOSIS — R51 Headache: Secondary | ICD-10-CM | POA: Insufficient documentation

## 2012-09-11 DIAGNOSIS — F17200 Nicotine dependence, unspecified, uncomplicated: Secondary | ICD-10-CM | POA: Insufficient documentation

## 2012-09-11 DIAGNOSIS — Z87828 Personal history of other (healed) physical injury and trauma: Secondary | ICD-10-CM | POA: Insufficient documentation

## 2012-09-11 DIAGNOSIS — G40909 Epilepsy, unspecified, not intractable, without status epilepticus: Secondary | ICD-10-CM | POA: Insufficient documentation

## 2012-09-11 DIAGNOSIS — Z79899 Other long term (current) drug therapy: Secondary | ICD-10-CM | POA: Insufficient documentation

## 2012-09-11 DIAGNOSIS — G589 Mononeuropathy, unspecified: Secondary | ICD-10-CM | POA: Insufficient documentation

## 2012-09-11 DIAGNOSIS — IMO0001 Reserved for inherently not codable concepts without codable children: Secondary | ICD-10-CM | POA: Insufficient documentation

## 2012-09-11 NOTE — ED Notes (Signed)
Pt has had a slipped disc since 2001. Pt is basically here for pain meds.

## 2012-09-11 NOTE — ED Provider Notes (Signed)
History  This chart was scribed for [employer]. Colon Branch, MD by Nathan Mckay, ED Scribe. This patient was seen in room APA06/APA06 and the patient's care was started at 11:30 PM.  CSN: [id number]  Arrival date & time 09/11/12  2148   First MD Initiated Contact with Patient 09/11/12 2330      Chief Complaint  Patient presents with  . Back Pain     The history is provided by the patient. No language interpreter was used.    HPI Comments: Nathan Mckay is a 37 y.o. male with a h/o slipped disc and fibromyaglia who presents to the Emergency Department requesting pain control for chronic back pain described as "pins and needles" that radiates down bilateral legs. He reports that the symptoms are worse when laying down at night. He states that he was being treated with methadone with improvement but this treatment was stopped. He has also tried neurontin in the past with no improvement. Pt states that he is on his feet with working and taking care of two households which has worsened the symptoms. He states that he f/u with Dr. Gerilyn Mckay for epilepsy but denies any neuropathy diagnoses. He denies that he is f/u with a pain management clinic due to improper insurance. He admits that he has an appointment with a pain clinic tomorrow but was unable to sleep due to pain. Pt wears steel toed shoes to work and has had the same pair for the past year. He denies any other symptoms. Pt is a current everyday smoker but denies alcohol use.  Past Medical History  Diagnosis Date  . Seizures   . Chronic headaches   . Chronic back pain   . Polysubstance abuse   . Recurrent shoulder dislocation   . Sciatica   . Fibromyalgia     Past Surgical History  Procedure Laterality Date  . Hand surgery      History reviewed. No pertinent family history.  History  Substance Use Topics  . Smoking status: Current Every Day Smoker    Types: Cigarettes  . Smokeless tobacco: Not on file  . Alcohol Use: No       Review of Systems  Constitutional: Negative for fever.       10 systems reviewed and are negative for acute change except as noted in the HPI  HENT: Negative for congestion.   Eyes: Negative for discharge and redness.  Respiratory: Negative for cough and shortness of breath.   Cardiovascular: Negative for chest pain.  Gastrointestinal: Negative for vomiting and abdominal pain.  Musculoskeletal: Positive for back pain.  Skin: Negative for rash.  Neurological: Negative for syncope, numbness and headaches.  Psychiatric/Behavioral:       No behavior change    Allergies  Nickel  Home Medications   Current Outpatient Rx  Name  Route  Sig  Dispense  Refill  . acetaminophen (TYLENOL) 325 MG tablet   Oral   Take 975 mg by mouth every 6 (six) hours as needed. pain         . oxymetazoline (AFRIN) 0.05 % nasal spray   Nasal   Place 2 sprays into the nose 2 (two) times daily.           Triage Vitals: BP 122/82  Pulse 114  Temp(Src) 98.5 F (36.9 C) (Oral)  Resp 20  Ht 5\' 9"  (1.753 m)  Wt 180 lb (81.647 kg)  BMI 26.57 kg/m2  SpO2 98%  Physical Exam  Nursing note and vitals  reviewed. Constitutional: He is oriented to person, place, and time. He appears well-developed and well-nourished. No distress.  HENT:  Head: Normocephalic and atraumatic.  Eyes: Conjunctivae and EOM are normal.  Neck: Neck supple. No tracheal deviation present.  Cardiovascular: Normal rate and regular rhythm.  Exam reveals no gallop and no friction rub.   No murmur heard. Pulmonary/Chest: Effort normal and breath sounds normal. No respiratory distress.  Abdominal: Soft. There is no tenderness.  Musculoskeletal: Normal range of motion.  Neurological: He is alert and oriented to person, place, and time.  Skin: Skin is warm and dry.  Psychiatric: He has a normal mood and affect. His behavior is normal.    ED Course  Procedures (including critical care time)  DIAGNOSTIC STUDIES: Oxygen  Saturation is 98% on room air, normal by my interpretation.    COORDINATION OF CARE: 11:35 PM-Advised pt that his symptoms are most likely neuropathy and stated that he needs to f/u with a pain clinic. Discussed treatment plan which includes one dose of percocet with pt at bedside and pt agreed to plan. Advised pt to f/u with pain clinic as planned tomorrow and to get a new pair of steel toed boots every 6 months.    MDM  Patient with chonic back pain and neuropathy here with recurrent pain. Given percocet. Pt stable in ED with no significant deterioration in condition.The patient appears reasonably screened and/or stabilized for discharge and I doubt any other medical condition or other [facility] requiring further screening, evaluation, or treatment in the ED at this time prior to discharge.  I personally performed the services described in this documentation, which was scribed in my presence. The recorded information has been reviewed and considered.   MDM Reviewed: nursing note and vitals           Nathan Mckay. Colon Branch, MD 09/12/12 [phone]

## 2012-09-12 MED ORDER — OXYCODONE-ACETAMINOPHEN 5-325 MG PO TABS
1.0000 | ORAL_TABLET | Freq: Once | ORAL | Status: AC
  Administered 2012-09-12: 1 via ORAL
  Filled 2012-09-12: qty 1

## 2012-09-12 NOTE — ED Notes (Signed)
Discharge instructions reviewed with pt, questions answered. Pt verbalized understanding.  

## 2013-03-18 ENCOUNTER — Emergency Department (HOSPITAL_COMMUNITY)
Admission: EM | Admit: 2013-03-18 | Discharge: 2013-03-19 | Disposition: A | Discharge status: Home / Self Care | Payer: Self-pay | Attending: Emergency Medicine | Admitting: Emergency Medicine

## 2013-03-18 ENCOUNTER — Encounter (HOSPITAL_COMMUNITY): Payer: Self-pay | Admitting: Emergency Medicine

## 2013-03-18 DIAGNOSIS — Z79899 Other long term (current) drug therapy: Secondary | ICD-10-CM | POA: Insufficient documentation

## 2013-03-18 DIAGNOSIS — S0180XA Unspecified open wound of other part of head, initial encounter: Secondary | ICD-10-CM | POA: Insufficient documentation

## 2013-03-18 DIAGNOSIS — S0101XA Laceration without foreign body of scalp, initial encounter: Secondary | ICD-10-CM

## 2013-03-18 DIAGNOSIS — S0181XA Laceration without foreign body of other part of head, initial encounter: Secondary | ICD-10-CM

## 2013-03-18 DIAGNOSIS — M545 Low back pain, unspecified: Secondary | ICD-10-CM | POA: Insufficient documentation

## 2013-03-18 DIAGNOSIS — F17200 Nicotine dependence, unspecified, uncomplicated: Secondary | ICD-10-CM | POA: Insufficient documentation

## 2013-03-18 DIAGNOSIS — G8929 Other chronic pain: Secondary | ICD-10-CM | POA: Insufficient documentation

## 2013-03-18 DIAGNOSIS — S0100XA Unspecified open wound of scalp, initial encounter: Secondary | ICD-10-CM | POA: Insufficient documentation

## 2013-03-18 DIAGNOSIS — S0990XA Unspecified injury of head, initial encounter: Secondary | ICD-10-CM | POA: Insufficient documentation

## 2013-03-18 DIAGNOSIS — Y92009 Unspecified place in unspecified non-institutional (private) residence as the place of occurrence of the external cause: Secondary | ICD-10-CM | POA: Insufficient documentation

## 2013-03-18 DIAGNOSIS — Y9389 Activity, other specified: Secondary | ICD-10-CM | POA: Insufficient documentation

## 2013-03-18 DIAGNOSIS — W11XXXA Fall on and from ladder, initial encounter: Secondary | ICD-10-CM | POA: Insufficient documentation

## 2013-03-18 NOTE — ED Notes (Signed)
Patient states he fell and hit his head and face.  Patient with laceration under lip and back of head.

## 2013-03-19 ENCOUNTER — Emergency Department (HOSPITAL_COMMUNITY): Payer: Self-pay

## 2013-03-19 MED ORDER — OXYCODONE-ACETAMINOPHEN 5-325 MG PO TABS: 1.0000 | ORAL_TABLET | ORAL | Status: DC | PRN

## 2013-03-19 MED ORDER — OXYCODONE-ACETAMINOPHEN 5-325 MG PO TABS
1.0000 | ORAL_TABLET | Freq: Once | ORAL | Status: AC
  Administered 2013-03-19: 1 via ORAL
  Filled 2013-03-19: qty 1

## 2013-03-19 MED ORDER — TRAMADOL HCL 50 MG PO TABS: 50.0000 mg | ORAL_TABLET | Freq: Four times a day (QID) | ORAL | Status: DC | PRN

## 2013-03-19 MED ORDER — HYDROMORPHONE HCL PF 1 MG/ML IJ SOLN
1.0000 mg | Freq: Once | INTRAMUSCULAR | Status: AC
  Administered 2013-03-19: 1 mg via INTRAMUSCULAR
  Filled 2013-03-19: qty 1

## 2013-03-19 MED ORDER — LIDOCAINE-EPINEPHRINE (PF) 1 %-1:200000 IJ SOLN
10.0000 mL | Freq: Once | INTRAMUSCULAR | Status: AC
  Administered 2013-03-19: 10 mL
  Filled 2013-03-19: qty 10

## 2013-03-19 MED ORDER — IBUPROFEN 600 MG PO TABS: 600.0000 mg | ORAL_TABLET | Freq: Four times a day (QID) | ORAL | Status: DC | PRN

## 2013-03-19 NOTE — ED Provider Notes (Signed)
Medical screening examination/treatment/procedure(s) were performed by non-physician practitioner and as supervising physician I was immediately available for consultation/collaboration.   Elray Schae Cando, MD 03/19/13 0654 

## 2013-03-19 NOTE — ED Provider Notes (Signed)
CSN: [id number]     Arrival date & time 03/18/13  2314 History   First MD Initiated Contact with Patient 03/18/13 2333     Chief Complaint  Patient presents with  . Laceration   (Consider location/radiation/quality/duration/timing/severity/associated sxs/prior Treatment) HPI Comments: Nathan Mckay is a 37 y.o. Male with a history significant for chronic pain including headaches and back pain, fibromyalgia and also a history of polysubstance abuse presenting for head and facial injury after falling approximately 10 feet from a ladder as he was working on lighting on the eaves of his home.  He landed on his front porch,  Causing laceration to his posterior scalp and also his lower lip, hitting his posterior head on the porch, unsure how he obtained the lip laceration.  He reports a moderate headache but denies loc.  He also reports low back pain which he states is his chronic pain, and no worse today.  He is utd on his tetanus.  He denies dizziness, confusion, difficulty with ambulation and has no numbness or weakness in his extremities. He has applied pressure to the wounds and has obtained hemostasis.     The history is provided by a friend and the patient.    Past Medical History  Diagnosis Date  . Seizures   . Chronic headaches   . Chronic back pain   . Polysubstance abuse   . Recurrent shoulder dislocation   . Sciatica   . Fibromyalgia    Past Surgical History  Procedure Laterality Date  . Hand surgery     No family history on file. History  Substance Use Topics  . Smoking status: Current Every Day Smoker    Types: Cigarettes  . Smokeless tobacco: Not on file  . Alcohol Use: No    Review of Systems  Constitutional: Negative for fever.  HENT: Negative for congestion, dental problem, ear pain, facial swelling and sore throat.   Eyes: Negative.  Negative for visual disturbance.  Respiratory: Negative for chest tightness and shortness of breath.   Cardiovascular:  Negative for chest pain.  Gastrointestinal: Negative for nausea, vomiting and abdominal pain.  Genitourinary: Negative.   Musculoskeletal: Positive for back pain. Negative for arthralgias, joint swelling and neck pain.  Skin: Positive for wound. Negative for rash.  Neurological: Positive for headaches. Negative for dizziness, weakness, light-headedness and numbness.  Psychiatric/Behavioral: Negative.     Allergies  Nickel  Home Medications   Current Outpatient Rx  Name  Route  Sig  Dispense  Refill  . acetaminophen (TYLENOL) 325 MG tablet   Oral   Take 975 mg by mouth every 6 (six) hours as needed. pain         . oxymetazoline (AFRIN) 0.05 % nasal spray   Nasal   Place 2 sprays into the nose 2 (two) times daily.          BP 106/80  Pulse 109  Temp(Src) 98.4 F (36.9 C) (Oral)  Resp 20  Ht 5\' 9"  (1.753 m)  Wt 195 lb (88.451 kg)  BMI 28.78 kg/m2  SpO2 96% Physical Exam  Nursing note and vitals reviewed. Constitutional: He appears well-developed and well-nourished. No distress.  HENT:  Head: Normocephalic. Head is with laceration. Head is without raccoon's eyes and without Battle's sign.    Mouth/Throat: Uvula is midline and oropharynx is clear and moist. No oral lesions. Normal dentition.    3 cm hemostatic laceration occipital scalp, subcutaneous.  2 cm subcutaneous laceration inferior chin, does not involve the  vermillion border.  Eyes: Conjunctivae and EOM are normal. Pupils are equal, round, and reactive to light.  Neck: Normal range of motion.  Cardiovascular: Normal rate, regular rhythm, normal heart sounds and intact distal pulses.   Pulmonary/Chest: Effort normal and breath sounds normal. He has no wheezes.  Abdominal: Soft. Bowel sounds are normal. There is no tenderness.  Musculoskeletal: Normal range of motion.  Neurological: He is alert.  Skin: Skin is warm and dry.  Psychiatric: He has a normal mood and affect.    ED Course  Procedures  (including critical care time)  LACERATION REPAIR Performed by: Burgess Amor Authorized by: Burgess Amor Consent: Verbal consent obtained. Risks and benefits: risks, benefits and alternatives were discussed Consent given by: patient Patient identity confirmed: provided demographic data Prepped and Draped in normal sterile fashion Wound explored  Laceration Location: chin/lower lip  Laceration Length: 3cm  No Foreign Bodies seen or palpated  Anesthesia: local infiltration  Local anesthetic: lidocaine 2% with epinephrine  Anesthetic total: 2 ml  Irrigation method: syringe Amount of cleaning: standard  Skin closure: ethilon 5-0  Number of sutures: 6  Technique: simple interrupted  Patient tolerance: Patient tolerated the procedure well with no immediate complications.   LACERATION REPAIR Performed by: Burgess Amor Authorized by: Burgess Amor Consent: Verbal consent obtained. Risks and benefits: risks, benefits and alternatives were discussed Consent given by: patient Patient identity confirmed: provided demographic data Prepped and Draped in normal sterile fashion Wound explored  Laceration Location: posterior scalp  Laceration Length: 3cm  No Foreign Bodies seen or palpated  Anesthesia: local infiltration  Local anesthetic: lidocaine 3% with epinephrine  Anesthetic total: 2.5 ml  Irrigation method: syringe Amount of cleaning: standard  Skin closure: staples  Number of sutures: 6  Technique: staples  Patient tolerance: Patient tolerated the procedure well with no immediate complications.   Labs Review Labs Reviewed - No data to display Imaging Review No results found.  EKG Interpretation   None       MDM  No diagnosis found. Patients labs and/or radiological studies were viewed and considered during the medical decision making and disposition process.  Wound care instructions given.  Pt advised to have sutures removed in 5-6 days, staples  in 10. Return here sooner for any signs of infection including redness, swelling, worse pain or drainage of pus.  Head injury instruction given.   While patient was sitting for stitches,  He had complaints of worsening low back pain.  He was sent for LS spine prior to dc home.  No acute findings.  Pt was discharged home with ibuprofen, tramadol prescriptions.      Burgess Amor, PA-C 03/19/13 [phone]

## 2013-03-20 MED FILL — Oxycodone w/ Acetaminophen Tab 5-325 MG: ORAL | Qty: 6 | Status: AC

## 2013-07-27 ENCOUNTER — Emergency Department (HOSPITAL_COMMUNITY)
Admission: EM | Admit: 2013-07-27 | Discharge: 2013-07-27 | Disposition: A | Discharge status: Home / Self Care | Payer: Self-pay | Attending: Emergency Medicine | Admitting: Emergency Medicine

## 2013-07-27 ENCOUNTER — Encounter (HOSPITAL_COMMUNITY): Payer: Self-pay | Admitting: Emergency Medicine

## 2013-07-27 DIAGNOSIS — Z8669 Personal history of other diseases of the nervous system and sense organs: Secondary | ICD-10-CM | POA: Insufficient documentation

## 2013-07-27 DIAGNOSIS — Z91199 Patient's noncompliance with other medical treatment and regimen due to unspecified reason: Secondary | ICD-10-CM | POA: Insufficient documentation

## 2013-07-27 DIAGNOSIS — M549 Dorsalgia, unspecified: Secondary | ICD-10-CM

## 2013-07-27 DIAGNOSIS — Z7982 Long term (current) use of aspirin: Secondary | ICD-10-CM | POA: Insufficient documentation

## 2013-07-27 DIAGNOSIS — F17200 Nicotine dependence, unspecified, uncomplicated: Secondary | ICD-10-CM | POA: Insufficient documentation

## 2013-07-27 DIAGNOSIS — Z9119 Patient's noncompliance with other medical treatment and regimen: Secondary | ICD-10-CM | POA: Insufficient documentation

## 2013-07-27 DIAGNOSIS — M545 Low back pain, unspecified: Secondary | ICD-10-CM | POA: Insufficient documentation

## 2013-07-27 MED ORDER — OXYCODONE-ACETAMINOPHEN 5-325 MG PO TABS: 1.0000 | ORAL_TABLET | Freq: Three times a day (TID) | ORAL | Status: DC | PRN

## 2013-07-27 NOTE — Discharge Instructions (Signed)
As discussed, with your ongoing back pain it is important that you followup with both a primary care physician in the pain management Center. Please be sure to followup as soon as possible.  You may also consider adjunctive therapy such as chiropractic or acupuncture for additional pain relief.  Return here for concerning changes in your condition. Chronic Back Pain  When back pain lasts longer than 3 months, it is called chronic back pain.People with chronic back pain often go through certain periods that are more intense (flare-ups).  CAUSES Chronic back pain can be caused by wear and tear (degeneration) on different structures in your back. These structures include:  The bones of your spine (vertebrae) and the joints surrounding your spinal cord and nerve roots (facets).  The strong, fibrous tissues that connect your vertebrae (ligaments). Degeneration of these structures may result in pressure on your nerves. This can lead to constant pain. HOME CARE INSTRUCTIONS  Avoid bending, heavy lifting, prolonged sitting, and activities which make the problem worse.  Take brief periods of rest throughout the day to reduce your pain. Lying down or standing usually is better than sitting while you are resting.  Take over-the-counter or prescription medicines only as directed by your caregiver. SEEK IMMEDIATE MEDICAL CARE IF:   You have weakness or numbness in one of your legs or feet.  You have trouble controlling your bladder or bowels.  You have nausea, vomiting, abdominal pain, shortness of breath, or fainting. Document Released: 05/07/2004 Document Revised: 06/22/2011 Document Reviewed: 03/14/2011 [facility] Patient Information 2014 [place], [place].

## 2013-07-27 NOTE — ED Notes (Signed)
Pt reports that he was going to follow-up with pain management facility.  Family member at bedside, tearful.  Family member states that she is going to find him help.  Pt requested a few pain meds to help him until he was able to get an appt with pain clinic.  Notified edp

## 2013-07-27 NOTE — ED Provider Notes (Addendum)
CSN: [id number]     Arrival date & time 07/27/13  1137 History   First MD Initiated Contact with Patient 07/27/13 1209     Chief Complaint  Patient presents with  . Medical Clearance     (Consider location/radiation/quality/duration/timing/severity/associated sxs/prior Treatment) HPI Patient presents with concern of ongoing back pain. Patient states that he has pain that is"always"in his lower back with radiation down his legs. He denies new falls, incontinence, weakness, suicidal thoughts, homicidal thoughts. Patient has had multiple times for pain relief in the past, including failed therapy with methadone, relapsed into substance abuse, noncompliance with medication. He states that currently he has not been able to follow up with a primary care physician or a pain management center do to prior positive drug screens. Today the patient's pain became more concerning, and he is here for evaluation.  Patient currently denies fevers, chills, other complaints. Endorses a substantial frustration at the inability to achieve pain relief for pain that he has had for 15 years.  Past Medical History  Diagnosis Date  . Seizures    Past Surgical History  Procedure Laterality Date  . Wrist surgery     No family history on file. History  Substance Use Topics  . Smoking status: Current Every Day Smoker  . Smokeless tobacco: Not on file  . Alcohol Use: No    Review of Systems  All other systems reviewed and are negative.     Allergies  Nickel  Home Medications   Prior to Admission medications   Medication Sig Start Date End Date Taking? Authorizing Provider  acetaminophen (TYLENOL) 500 MG tablet Take 1,000 mg by mouth every 6 (six) hours as needed for headache.   Yes Historical Provider, MD  aspirin 325 MG tablet Take 325 mg by mouth every 6 (six) hours as needed for headache.   Yes Historical Provider, MD   BP 101/67  Pulse 77  Temp(Src) 98.4 F (36.9 C)  Resp 18  Ht 5\' 9"   (1.753 m)  Wt 200 lb (90.719 kg)  BMI 29.52 kg/m2  SpO2 98%  Patient finished lunch just prior to my evaluation, demonstrating tolerance of oral intake with no distress.  Physical Exam  Nursing note and vitals reviewed. Constitutional: He is oriented to person, place, and time. He appears well-developed. No distress.  HENT:  Head: Normocephalic and atraumatic.  Eyes: Conjunctivae and EOM are normal.  Pulmonary/Chest: Effort normal. No stridor. No respiratory distress.  Abdominal: He exhibits no distension.  Musculoskeletal: He exhibits no edema.  Neurological: He is alert and oriented to person, place, and time. No cranial nerve deficit. Gait normal.  Skin: Skin is warm and dry.  Psychiatric: He has a normal mood and affect.    ED Course  Procedures (including critical care time)  Update: Patient's family members here.  She voices concern for the patient's ongoing pain management issues. She states that she will assist the patient with obtaining further care as an outpatient. They request a short course of medications to bridge him until he has seen a pain management specialist. She states that she will controlled with medication and provided only as indicated.   Update: Patient has a family member who provided IVC commitment papers from the magistrate's office. Patient is non-violent, cooperative, non-suicidal, non-homicidal, and is not currently a threat.  He does not meet criteria for IVC.  MDM   Final diagnoses:  Back pain    The patient presents with persistent low back pain.  On exam  he is awake, alert, clearly in no distress, hemodynamically stable, generally well-appearing.  Patient has no evidence of decompensation.  I had a lengthy conversation with him about chronic pain need for additional evaluation, management as an outpatient, and absent distress or evidence of any decompensation, and with the patient's endorsement of his pain as being"the same"as always, he was  discharged in stable condition.    Carmin Muskrat, MD 07/27/13 1321  Carmin Muskrat, MD 07/27/13 [phone]

## 2013-07-27 NOTE — ED Notes (Signed)
Pt states police were called to his residence because he was "acting out" due to pain. Pt denies si/hi. Pt states he wants to get help for pain relief. Pt voluntary.

## 2013-07-28 ENCOUNTER — Encounter (HOSPITAL_COMMUNITY): Payer: Self-pay | Admitting: Emergency Medicine

## 2013-08-01 MED FILL — Oxycodone w/ Acetaminophen Tab 5-325 MG: ORAL | Qty: 6 | Status: AC

## 2013-10-14 ENCOUNTER — Encounter (HOSPITAL_COMMUNITY): Payer: Self-pay | Admitting: Emergency Medicine

## 2013-10-14 ENCOUNTER — Emergency Department (HOSPITAL_COMMUNITY)
Admission: EM | Admit: 2013-10-14 | Discharge: 2013-10-14 | Disposition: A | Discharge status: Home / Self Care | Payer: Self-pay | Attending: Emergency Medicine | Admitting: Emergency Medicine

## 2013-10-14 DIAGNOSIS — M549 Dorsalgia, unspecified: Secondary | ICD-10-CM | POA: Insufficient documentation

## 2013-10-14 DIAGNOSIS — F1320 Sedative, hypnotic or anxiolytic dependence, uncomplicated: Secondary | ICD-10-CM | POA: Insufficient documentation

## 2013-10-14 DIAGNOSIS — F1410 Cocaine abuse, uncomplicated: Secondary | ICD-10-CM | POA: Insufficient documentation

## 2013-10-14 DIAGNOSIS — Z7982 Long term (current) use of aspirin: Secondary | ICD-10-CM | POA: Insufficient documentation

## 2013-10-14 DIAGNOSIS — F1910 Other psychoactive substance abuse, uncomplicated: Secondary | ICD-10-CM

## 2013-10-14 DIAGNOSIS — F17200 Nicotine dependence, unspecified, uncomplicated: Secondary | ICD-10-CM | POA: Insufficient documentation

## 2013-10-14 DIAGNOSIS — G8929 Other chronic pain: Secondary | ICD-10-CM | POA: Insufficient documentation

## 2013-10-14 DIAGNOSIS — F411 Generalized anxiety disorder: Secondary | ICD-10-CM | POA: Insufficient documentation

## 2013-10-14 DIAGNOSIS — M5430 Sciatica, unspecified side: Secondary | ICD-10-CM | POA: Insufficient documentation

## 2013-10-14 DIAGNOSIS — Z79899 Other long term (current) drug therapy: Secondary | ICD-10-CM | POA: Insufficient documentation

## 2013-10-14 DIAGNOSIS — Z87828 Personal history of other (healed) physical injury and trauma: Secondary | ICD-10-CM | POA: Insufficient documentation

## 2013-10-14 DIAGNOSIS — F1110 Opioid abuse, uncomplicated: Secondary | ICD-10-CM | POA: Insufficient documentation

## 2013-10-14 LAB — CBC WITH DIFFERENTIAL/PLATELET
BASOS ABS: 0 10*3/uL (ref 0.0–0.1)
BASOS PCT: 0 % (ref 0–1)
Eosinophils Absolute: 0 10*3/uL (ref 0.0–0.7)
Eosinophils Relative: 1 % (ref 0–5)
HEMATOCRIT: 41.9 % (ref 39.0–52.0)
Hemoglobin: 14.1 g/dL (ref 13.0–17.0)
LYMPHS PCT: 40 % (ref 12–46)
Lymphs Abs: 2.5 10*3/uL (ref 0.7–4.0)
MCH: 29.7 pg (ref 26.0–34.0)
MCHC: 33.7 g/dL (ref 30.0–36.0)
MCV: 88.4 fL (ref 78.0–100.0)
MONO ABS: 0.4 10*3/uL (ref 0.1–1.0)
Monocytes Relative: 6 % (ref 3–12)
NEUTROS ABS: 3.3 10*3/uL (ref 1.7–7.7)
NEUTROS PCT: 53 % (ref 43–77)
Platelets: 169 10*3/uL (ref 150–400)
RBC: 4.74 MIL/uL (ref 4.22–5.81)
RDW: 12.8 % (ref 11.5–15.5)
WBC: 6.3 10*3/uL (ref 4.0–10.5)

## 2013-10-14 LAB — COMPREHENSIVE METABOLIC PANEL
ALBUMIN: 4.2 g/dL (ref 3.5–5.2)
ALK PHOS: 73 U/L (ref 39–117)
ALT: 27 U/L (ref 0–53)
AST: 23 U/L (ref 0–37)
Anion gap: 14 (ref 5–15)
BILIRUBIN TOTAL: 0.5 mg/dL (ref 0.3–1.2)
BUN: 21 mg/dL (ref 6–23)
CHLORIDE: 102 meq/L (ref 96–112)
CO2: 24 meq/L (ref 19–32)
CREATININE: 1.56 mg/dL — AB (ref 0.50–1.35)
Calcium: 9.3 mg/dL (ref 8.4–10.5)
GFR calc Af Amer: 65 mL/min — ABNORMAL LOW (ref >90)
GFR, EST NON AFRICAN AMERICAN: 56 mL/min — AB (ref >90)
Glucose, Bld: 96 mg/dL (ref 70–99)
POTASSIUM: 4.3 meq/L (ref 3.7–5.3)
SODIUM: 140 meq/L (ref 137–147)
Total Protein: 7.6 g/dL (ref 6.0–8.3)

## 2013-10-14 LAB — RAPID URINE DRUG SCREEN, HOSP PERFORMED
Amphetamines: NOT DETECTED
BARBITURATES: NOT DETECTED
Benzodiazepines: POSITIVE — AB
Cocaine: POSITIVE — AB
Opiates: POSITIVE — AB
Tetrahydrocannabinol: NOT DETECTED

## 2013-10-14 LAB — ETHANOL: Alcohol, Ethyl (B): <11 mg/dL (ref 0–11)

## 2013-10-14 NOTE — BH Assessment (Signed)
Assessment Note  Nathan Mckay is an 38 y.o. male presenting to [facility] ED with the chief complaint of severe pain. Pt stated "I am in severe pain both emotional and physical". "I have had the same aggravating problem and I am unable to acquire relief". Pt reported that he has been with back pain for the past 15 years with little relief.  Pt stated "I want to get started with a doctor and get this problem taken care of".  Pt is alert and oriented x3. Pt denies SI, HI, AH and VH at this time. Pt stated that when he initially presented to the ED he was tearful and crying and reported that he himself was hurting but he denies any suicidal ideations. Pt did not report any previous suicide attempts or hospitalizations. Pt shared that he received mental health treatment while receiving methadone from his doctor. Pt reported that he was kicked out of the methadone clinic due to failing his drug screen. Pt denied any illicit substance abuse; however pt stated "I don't feel like I'm going the right way with purchasing my medications". Pt denied having his medications prescribed by a medical doctor. Pt reported that he is dealing with multiple stressors such as inability to maintain a job, relationship problems, transportation issues and financial problems.  Pt is also reporting some depressive symptoms. Pt shared that his sleep is inconsistent and stated "if I get 4 hours of sleep I am good". Pt did not report any issues with his appetite. Pt denied having access to any weapons; however pt stated "I know how to make weapons". Pt reported that he did have a larceny charge and recently completed his community service hours.  Pt denied any illicit substance use but his drug screen was positive for cocaine, opiates, and benzodiazepines. Pt reported that he has a history of seizures but could not provide a timeframe of his most recent seizure. Pt reported that he lives with his family and counts on them for support as well.    Axis I: See current hospital problem list Axis II: Deferred Axis III:  Past Medical History  Diagnosis Date  . Chronic headaches   . Chronic back pain   . Polysubstance abuse   . Recurrent shoulder dislocation   . Sciatica   . Fibromyalgia   . Seizures    Axis IV: economic problems, occupational problems, other psychosocial or environmental problems and problems related to legal system/crime Axis V: 41-50 serious symptoms  Past Medical History:  Past Medical History  Diagnosis Date  . Chronic headaches   . Chronic back pain   . Polysubstance abuse   . Recurrent shoulder dislocation   . Sciatica   . Fibromyalgia   . Seizures     Past Surgical History  Procedure Laterality Date  . Hand surgery    . Wrist surgery      Family History: No family history on file.  Social History:  reports that he has been smoking Cigarettes.  He has been smoking about 0.00 packs per day. He does not have any smokeless tobacco history on file. He reports that he does not drink alcohol or use illicit drugs.  Additional Social History:  Alcohol / Drug Use Pain Medications: Pt reported that he buys pain medication off of the street.  Prescriptions: Pt reported that he buys pain medication off of the street.  Over the Counter: denies abuse  History of alcohol / drug use?: Yes Negative Consequences of Use: Financial;Personal relationships;Legal  Substance #1 Name of Substance 1: Cocaine  1 - Age of First Use: unknown  1 - Amount (size/oz): unknown  1 - Frequency: unknown  1 - Duration: unknown  1 - Last Use / Amount: unknown   CIWA: CIWA-Ar BP: 111/85 mmHg Pulse Rate: 96 COWS:    Allergies:  Allergies  Allergen Reactions  . Nickel   . Nickel Rash    Home Medications:  (Not in a hospital admission)  OB/GYN Status:  No LMP for male patient.  General Assessment Data Location of Assessment: [facility] ED Is this a Tele or Face-to-Face Assessment?: Tele Assessment Is this an Initial  Assessment or a Re-assessment for this encounter?: Initial Assessment Living Arrangements: Parent Can pt return to current living arrangement?: Yes Admission Status: Voluntary Is patient capable of signing voluntary admission?: Yes Transfer from: Home Referral Source: Self/Family/Friend     [place] Living Arrangements: Parent Name of Psychiatrist: None reported Name of Therapist: None reported  Education Status Is patient currently in school?: No  Risk to self Suicidal Ideation: No Suicidal Intent: No Is patient at risk for suicide?: No Suicidal Plan?: No Access to Means: No What has been your use of drugs/alcohol within the last 12 months?: daily  Previous Attempts/Gestures: No How many times?: 0 Other Self Harm Risks: no other self harm risk identified at this time.  Triggers for Past Attempts: None known Intentional Self Injurious Behavior: None Family Suicide History: No Recent stressful life event(s): Job Loss;Financial Problems;Other (Comment) (Relationship problems ) Persecutory voices/beliefs?: No Depression: Yes Depression Symptoms: Tearfulness;Guilt;Fatigue;Feeling angry/irritable Substance abuse history and/or treatment for substance abuse?: Yes Suicide prevention information given to non-admitted patients: Not applicable  Risk to Others Homicidal Ideation: No Thoughts of Harm to Others: No Current Homicidal Intent: No Current Homicidal Plan: No Access to Homicidal Means: No Identified Victim: No victim identified at this time. History of harm to others?: No Assessment of Violence: None Noted Violent Behavior Description: No violent behavior reported Does patient have access to weapons?: No Criminal Charges Pending?: No (Recent larceny charge ) Does patient have a court date: No  Psychosis Hallucinations: None noted Delusions: None noted  Mental Status Report Appear/Hygiene: In scrubs Eye Contact: Good Motor Activity: Freedom of  movement Speech: Logical/coherent Level of Consciousness: Alert Mood: Depressed;Anxious;Irritable Affect: Appropriate to circumstance Anxiety Level: Minimal Thought Processes: Relevant;Coherent Judgement: Impaired Orientation: Person;Place;Situation;Time Obsessive Compulsive Thoughts/Behaviors: None  Cognitive Functioning Concentration: Normal Memory: Recent Intact;Remote Intact IQ: Average Insight: Good Impulse Control: Fair Appetite: Fair Weight Loss: 0 Weight Gain: 0 Sleep: No Change Total Hours of Sleep: 4 Vegetative Symptoms: None  ADLScreening [facility] Assessment Services) Patient's cognitive ability adequate to safely complete daily activities?: Yes Patient able to express need for assistance with ADLs?: Yes Independently performs ADLs?: Yes (appropriate for developmental age)  Prior Inpatient Therapy Prior Inpatient Therapy: No  Prior Outpatient Therapy Prior Outpatient Therapy: Yes Prior Therapy Dates: 2010 Prior Therapy Facilty/Provider(s): "[facility]" Reason for Treatment: Methadone, depression   ADL Screening (condition at time of admission) Patient's cognitive ability adequate to safely complete daily activities?: Yes Is the patient deaf or have difficulty hearing?: No Does the patient have difficulty seeing, even when wearing glasses/contacts?: No Does the patient have difficulty concentrating, remembering, or making decisions?: No Patient able to express need for assistance with ADLs?: Yes Does the patient have difficulty dressing or bathing?: No Independently performs ADLs?: Yes (appropriate for developmental age) Does the patient have difficulty walking or climbing stairs?: No  Abuse/Neglect Assessment (Assessment to be complete while patient is alone) Physical Abuse: Denies Verbal Abuse: Denies Sexual Abuse: Denies Exploitation of patient/patient's resources: Denies Self-Neglect: Denies Values / Beliefs Cultural Requests During  Hospitalization: None Spiritual Requests During Hospitalization: None        Additional Information 1:1 In Past 12 Months?: No CIRT Risk: No Elopement Risk: No     Disposition:  Disposition Initial Assessment Completed for this Encounter: Yes Disposition of Patient: Referred to Patient referred to: Other (Comment) (Pain management clinic )  On Site Evaluation by:   Reviewed with Physician:    Lilli Dewald S 10/14/2013 5:57 AM

## 2013-10-14 NOTE — ED Provider Notes (Addendum)
CSN: [id number]     Arrival date & time 10/14/13  0240 History   First MD Initiated Contact with Patient 10/14/13 [phone]     Chief Complaint  Patient presents with  . V70.1     (Consider location/radiation/quality/duration/timing/severity/associated sxs/prior Treatment) HPI 38 year old male presents stating that he needs help and "it's not fair, everyone keeps taking advantage of me I'm," states that he has chronic pain and that the only thing that controls his narcotic pain medicine. He states he is tired of going in circles. He continued to state that everyone is taking advantage of him including himself. States he has a history of depression but does not state that he wishes to harm himself fully that he "can't take it anymore".  Past Medical History  Diagnosis Date  . Chronic headaches   . Chronic back pain   . Polysubstance abuse   . Recurrent shoulder dislocation   . Sciatica   . Fibromyalgia   . Seizures    Past Surgical History  Procedure Laterality Date  . Hand surgery    . Wrist surgery     No family history on file. History  Substance Use Topics  . Smoking status: Current Every Day Smoker    Types: Cigarettes  . Smokeless tobacco: Not on file  . Alcohol Use: No    Review of Systems  Constitutional: Negative.   HENT: Negative.   Respiratory: Negative.   Cardiovascular: Negative.   Gastrointestinal: Negative.   Endocrine: Negative.   Genitourinary: Negative.   Musculoskeletal: Positive for back pain.  Allergic/Immunologic: Negative.   Neurological: Negative.   Hematological: Negative.   Psychiatric/Behavioral: Positive for dysphoric mood.      Allergies  Nickel and Nickel  Home Medications   Prior to Admission medications   Medication Sig Start Date End Date Taking? Authorizing Provider  acetaminophen (TYLENOL) 325 MG tablet Take 975 mg by mouth every 6 (six) hours as needed. pain    Historical Provider, MD  acetaminophen (TYLENOL) 500 MG tablet  Take 1,000 mg by mouth every 6 (six) hours as needed for headache.    Historical Provider, MD  aspirin 325 MG tablet Take 325 mg by mouth every 6 (six) hours as needed for headache.    Historical Provider, MD  ibuprofen (ADVIL,MOTRIN) 600 MG tablet Take 1 tablet (600 mg total) by mouth every 6 (six) hours as needed. 03/19/13   Evalee Jefferson, PA-C  oxyCODONE-acetaminophen (PERCOCET/ROXICET) 5-325 MG per tablet Take 1 tablet by mouth every 4 (four) hours as needed for severe pain. 37/1/69   [name], MD  oxyCODONE-acetaminophen (PERCOCET/ROXICET) 5-325 MG per tablet Take 1 tablet by mouth every 8 (eight) hours as needed for severe pain. 07/27/13   Carmin Muskrat, MD  oxymetazoline (AFRIN) 0.05 % nasal spray Place 2 sprays into the nose 2 (two) times daily.    Historical Provider, MD  traMADol (ULTRAM) 50 MG tablet Take 1 tablet (50 mg total) by mouth every 6 (six) hours as needed. 03/19/13   Evalee Jefferson, PA-C   BP 111/85  Pulse 96  Temp(Src) 98.1 F (36.7 C) (Oral)  Resp 20  Ht 5\' 10"  (1.778 m)  Wt 190 lb (86.183 kg)  BMI 27.26 kg/m2  SpO2 97% Physical Exam  Nursing note and vitals reviewed. Constitutional: He is oriented to person, place, and time. He appears well-developed and well-nourished.  HENT:  Head: Normocephalic and atraumatic.  Eyes: Conjunctivae and EOM are normal. Pupils are equal, round, and reactive to light.  Neck:  Normal range of motion. Neck supple.  Cardiovascular: Normal rate and regular rhythm.   Pulmonary/Chest: Effort normal and breath sounds normal.  Abdominal: Soft. Bowel sounds are normal.  Musculoskeletal: Normal range of motion.  Neurological: He is alert and oriented to person, place, and time. He has normal reflexes.  Skin: Skin is warm and dry.  Psychiatric: His speech is normal. His mood appears anxious. His affect is not angry. He is agitated. Thought content is paranoid. He does not exhibit a depressed mood.    ED Course  Procedures (including critical  care time) Labs Review Labs Reviewed  COMPREHENSIVE METABOLIC PANEL - Abnormal; Notable for the following:    Creatinine, Ser 1.56 (*)    GFR calc non Af Amer 56 (*)    GFR calc Af Amer 65 (*)    All other components within normal limits  CBC WITH DIFFERENTIAL  ETHANOL  URINE RAPID DRUG SCREEN (HOSP PERFORMED)    Imaging Review No results found.   EKG Interpretation None      MDM   Final diagnoses:  Substance abuse  Depression    Consult to tts pending    Shaune Pollack, MD 10/14/13 [phone]  TTS evaluated patient feels that he is stable for discharge  I have reexamined the patient. He states he feels that his rate goes. He denies suicidal ideation or plans. He states that he understands the followup plans that behavioral health discussed with him. He states that he was within walking distance.  Shaune Pollack, MD 10/14/13 [phone]

## 2013-10-14 NOTE — BH Assessment (Signed)
Assessment completed. Consulted with Serena Colonel, NP who agrees that patient does not meet inpatient criteria. Pt will be provided with a list of resources for pain management clinics. Dr. Jeanell Sparrow has been notified of the recommendations. Resource list will be faxed to [facility].

## 2013-10-14 NOTE — Discharge Instructions (Signed)
Substance Abuse Your exam indicates that you have a problem with substance abuse. Substance abuse is the misuse of alcohol or drugs that causes problems in family life, friendships, and work relationships. Substance abuse is the most important cause of premature illness, disability, and death in our society. It is also the greatest threat to a person's mental and spiritual well being. Substance abuse can start out in an innocent way, such as social drinking or taking a little extra medication prescribed by your doctor. No one starts out with the intention of becoming an alcoholic or an addict. Substance abuse victims cannot control their use of alcohol or drugs. They may become intoxicated daily or go on weekend binges. Often there is a strong desire to quit, but attempts to stop using often fail. Encounters with law enforcement or conflicts with family members, friends, and work associates are signs of a potential problem. Recovery is always possible, although the craving for some drugs makes it difficult to quit without assistance. Many treatment programs are available to help people stop abusing alcohol or drugs. The first step in treatment is to admit you have a problem. This is a major hurdle because denial is a powerful force with substance abuse. Alcoholics Anonymous, Narcotics Anonymous, Cocaine Anonymous, and other recovery groups and programs can be very useful in helping people to quit. If you do not feel okay about your drug or alcohol use and if it is causing you trouble, we want to encourage you to talk about it with your doctor or with someone from a recovery group who can help you. You could also call the [employer] on Drug Abuse at 1-800-662-HELP. It is up to you to take the first step. AL-ANON and ALA-TEEN are support groups for friends and family members of an alcohol or drug dependent person. The people who love and care for the alcoholic or addicted person often need help, too. For  information about these organizations, check your phone directory or call a local alcohol or drug treatment center. Document Released: 05/07/2004 Document Revised: 06/22/2011 Document Reviewed: 03/31/2008 [facility] Patient Information 2015 [place], [place]. This information is not intended to replace advice given to you by your health care provider. Make sure you discuss any questions you have with your health care provider.    Emergency Department Resource Guide 1) Find a Doctor and Pay Out of Pocket Although you won't have to find out who is covered by your insurance plan, it is a good idea to ask around and get recommendations. You will then need to call the office and see if the doctor you have chosen will accept you as a new patient and what types of options they offer for patients who are self-pay. Some doctors offer discounts or will set up payment plans for their patients who do not have insurance, but you will need to ask so you aren't surprised when you get to your appointment.  2) Contact Your Local Health Department Not all health departments have doctors that can see patients for sick visits, but many do, so it is worth a call to see if yours does. If you don't know where your local health department is, you can check in your phone book. The CDC also has a tool to help you locate your state's health department, and many state websites also have listings of all of their local health departments.  3) Find a [facility] If your illness is not likely to be very severe or complicated, you  may want to try a walk in clinic. These are popping up all over the country in pharmacies, drugstores, and shopping centers. They're usually staffed by nurse practitioners or physician assistants that have been trained to treat common illnesses and complaints. They're usually fairly quick and inexpensive. However, if you have serious medical issues or chronic medical problems, these are probably not your best  option.  No Primary Care Doctor: - Call Health Connect at  [phone] - they can help you locate a primary care doctor that  accepts your insurance, provides certain services, etc. - Physician Referral Service- [phone]  Chronic Pain Problems: Organization         Address  Phone   Notes  [facility]  [phone] Patients need to be referred by their primary care doctor.   Medication Assistance: Organization         Address  Phone   Notes  [facility] Medication [facility] [address] [place], [place] [address] [phone] --Must be a resident of [facility] -- Must have NO insurance coverage whatsoever (no Medicaid/ Medicare, etc.) -- The pt. MUST have a primary care doctor that directs their care regularly and follows them in the community   Med[facility]ist  [phone]   [employer]  7088193339    Agencies that provide inexpensive medical care: Organization         Address  Phone   Notes  [place]  [phone]   [facility]    [phone]   [facility] [place], [place] [address] [phone]   [place] [address] [place] [phone]   Planned Parenthood    [phone]   [facility]    [phone]   [place] and [address], [place] Phone:  [phone], Fax:  [phone] Hours of Operation:  9 am - 6 pm, M-F.  Also accepts Medicaid/Medicare and self-pay.  [facility] for [place], [address], [place] Phone: [phone], Fax: [phone]. Hours of Operation:  8:30 am - 5:30 pm, M-F.  Also accepts Medicaid and self-pay.  [facility] [place] [address] [place] Phone: [phone]   [facility], [place], [place] [phone], Ext. [phone] Mondays & Thursdays: 7-9 AM.  First 15  patients are seen on a first come, first serve basis.    [place] Providers:  Organization         Address  Phone   Notes  [facility] [address], [place] [phone] Also accepts self-pay patients.  [facility] [address], [place]  [phone]   [place], [address], [place] [phone]   [facility] [address], [place] [phone]   Lucianne Lei [address], [place]   [phone] Only accepts [place] Access [place] patients after they have their name applied to their card.   Self-Pay (no insurance) in [facility]:  Organization         Address  Phone   Notes  Sickle Cell Patients, [facility] [place] [phone]   [facility] [place] (  [facility] [place]  [place], [address], [place] [phone]   [facility]/Dr. Osei-Bonsu  [address], [place] or [address], [place] [phone] Phone number for both [place] and [place] locations is the same.  Urgent Medical and [facility] [address], [place] [phone]   [facility] [address] [place] or [address] [phone]   [facility] [address] [place] [phone], phone; [phone], fax Sees patients 1st and 3rd Saturday of every month.  Must not qualify for public or private insurance (i.e. Medicaid, Medicare, [place] Health Choice, Veterans' Benefits)  Household income should be no more than 200% of the poverty level The clinic cannot treat you if you are pregnant or think you are pregnant  Sexually transmitted diseases are not treated at the clinic.    Dental  Care: Organization         Address  Phone  Notes  [facility] Department of [facility] [place] [phone] Accepts children up to age 56 who are enrolled in [place] or [place]; pregnant women with a Medicaid card; and children who have applied for Medicaid or [place] Health Choice, but were declined, whose parents can pay a reduced fee at time of service.  [facility] Department of [facility]  [address], [place] [phone] Accepts children up to age 30 who are enrolled in [place] or [place]; pregnant women with a Medicaid card; and children who have applied for Medicaid or [place] Health Choice, but were declined, whose parents can pay a reduced fee at time of service.  [place] Adult Dental Access PROGRAM  [place] [phone] Patients are seen by appointment only. Walk-ins are not accepted. [place] will see patients 90 years of age and older. Monday - Tuesday (8am-5pm) Most Wednesdays (8:30-5pm) $30 per visit, cash only  [facility] Adult Dental Access PROGRAM  [address], [facility] [phone] Patients are seen by appointment only. Walk-ins are not accepted. [place] will see patients 39 years of age and older. One Wednesday Evening (Monthly: Volunteer Based).  $30 per visit, cash only  [place]  [phone] for adults; Children under age 49, call Graduate Pediatric Dentistry at [phone]. Children aged 99-14, please call [phone] to request a pediatric application.  Dental services are provided in all areas of dental care including fillings, crowns and bridges, complete and partial dentures, implants, gum treatment, root canals, and extractions. Preventive care is also provided. Treatment is provided to both adults and children. Patients are selected via a lottery and there is often a waiting list.   [facility] [address] [place]  [phone] [email or web address]   [facility] [address] [place], [place] [phone], Ext. [phone] Second and Fourth Thursday of each month, opens at 6:30 AM; Clinic ends at 9 AM.  Patients are seen on a first-come first-served basis, and a limited number are seen during each clinic.   [facility]  [address] [place]ard Danker [place], [place] [phone]   Eligibility Requirements You must have lived in [place], [place], or [place] counties for at least the last three months.   You cannot be eligible for  state or Museum/gallery conservator, including [employer], [place], or [employer].   You generally cannot be eligible for healthcare insurance through your employer.    How to apply: Eligibility screenings are held every Tuesday and Wednesday afternoon from 1:00 pm until 4:00 pm. You do not need an appointment for the interview!  [facility] [address] [place], [place]   [place]  [place] Department  [place]  [phone]    Behavioral Health Resources in the Community: Intensive Outpatient Programs Organization         Address  Phone  Notes  [place]. [address], [place], [place] [phone]   [facility] Outpatient [address] [place], [place]   ADS: Alcohol & Drug Svcs [address], [place], [place]   [place] [address]  [place], [place] or [phone]   Substance Abuse Resources Organization         Address  Phone  Notes  Alcohol and Drug Services  [phone]   [place]  [phone]   [facility] [place]   Chinita Pester  [phone]   Residential & Outpatient Substance Abuse Program  [phone]    Psychological Services Organization         Address  Phone  Notes  [facility] [place]  [place]  [phone]   [place] [address] [place] or [phone]    Mobile Crisis Teams Organization         Address  Phone  Notes  [facility], Mobile Crisis Care Unit  [phone]   [facility]  [address] [place], [place]   [place] [address] [place] [phone]    Self-Help/Support Groups Organization         Address  Phone             Notes  [place]. of [place] - variety of support groups  [place] Call for more information  Narcotics Anonymous (NA), [facility] [address], [employer]   2 meetings at this location   Special educational needs teacher         Address  Phone  Notes  [facility] Residential Treatment [place],    [place]  [phone]   [facility]  [address], [place] [phone], [place], [place]   [place], [place] [phone] Admissions: 8am-3pm M-F  Incentives Substance [place] [address]    [place], [place] [phone]   [facility] [address] Jadene Pierini [place], [place]   [facility] [address],  [place], [place]   [facility] Programs - Intensive Outpatient [address], [place], [place]   [facility] ([place].,  [place], [place] [phone] or [phone]   [facility]idential Treatment Services (RTS) [address], [place], [place] Accepts Medicaid  [place] [address],  [place] [phone] Substance Abuse/Addiction Treatment   [facility] Organization         Address  Phone  Notes  [facility]  [phone]   Domenic Schwab, PhD [place], [address], [place]   [phone]  [phone] or [phone]) [phone]   [facility]   [address] [place], [place] [phone]   [employer] [address], [place], [place] [phone] Insurance/Medicaid/sponsorship through [facility] and Families [address], [address]                                    [place], [place] [phone] [place] [address]   [place], [place] [phone]    Dr. Adele Schilder  [phone]   [facility] Dept. 1) [address], [place] 2) [place] 3)  [place] [address], [place] [phone]  [phone]   [place] [phone] or [phone] (After Hours)

## 2013-10-14 NOTE — ED Notes (Signed)
Pt is tearful and very emotional, states "I just can't take it anymore" and when questioned further, pt states he has been having trouble keeping a job because he has seizures and cannot afford his medication. Pt also having trouble with drugs off and on, trouble with his girlfriend.  When asked about thoughts of harming himself, pt cannot deny, but will not outright admit it.  Pt repeatedly states "I just can't do it anymore"  "I can't take the pain anymore"  When questioned about pain, pt states he hurts all over.  Pt admits to using dilaudid iv

## 2013-10-24 ENCOUNTER — Emergency Department (HOSPITAL_COMMUNITY)
Admission: EM | Admit: 2013-10-24 | Discharge: 2013-10-24 | Disposition: A | Discharge status: Home / Self Care | Payer: MEDICAID | Attending: Emergency Medicine | Admitting: Emergency Medicine

## 2013-10-24 ENCOUNTER — Encounter (HOSPITAL_COMMUNITY): Payer: Self-pay | Admitting: Emergency Medicine

## 2013-10-24 DIAGNOSIS — F17200 Nicotine dependence, unspecified, uncomplicated: Secondary | ICD-10-CM | POA: Insufficient documentation

## 2013-10-24 DIAGNOSIS — G8929 Other chronic pain: Secondary | ICD-10-CM | POA: Insufficient documentation

## 2013-10-24 DIAGNOSIS — Z8669 Personal history of other diseases of the nervous system and sense organs: Secondary | ICD-10-CM | POA: Insufficient documentation

## 2013-10-24 DIAGNOSIS — M549 Dorsalgia, unspecified: Secondary | ICD-10-CM | POA: Insufficient documentation

## 2013-10-24 DIAGNOSIS — Z79899 Other long term (current) drug therapy: Secondary | ICD-10-CM | POA: Insufficient documentation

## 2013-10-24 DIAGNOSIS — F1010 Alcohol abuse, uncomplicated: Secondary | ICD-10-CM | POA: Insufficient documentation

## 2013-10-24 DIAGNOSIS — Z87828 Personal history of other (healed) physical injury and trauma: Secondary | ICD-10-CM | POA: Insufficient documentation

## 2013-10-24 MED ORDER — MELOXICAM 15 MG PO TABS: 15.0000 mg | ORAL_TABLET | Freq: Every day | ORAL | Status: DC
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
## 2013-10-24 NOTE — ED Notes (Signed)
Pt angry and stating that prescribed medication will not work.  Informed pt that he needs to follow up with pain management physician. Pt refusing to leave. Explained that he was seen and evaluated.  Pt refused to sign discharge and was escorted out by security.

## 2013-10-24 NOTE — Discharge Instructions (Signed)

## 2013-10-24 NOTE — ED Notes (Addendum)
Pt reporting chronic back pain.  States that the only medication that ever worked for the pain was Methadone.  Explained that chronic pain is not treated in the ER and particularly not with a prescription for Methadone.  Pt reports that he took some Xanax today to help with pain.  Speech slurred and pupils pinpoint.  EDP at bedside.
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
## 2013-10-24 NOTE — ED Notes (Signed)
Pt has chronic back, pt has slurred speech, said he took xanax today. Has not other medication. Pt got into argument with family today over medication, he wants help with the pain. Pt is talkative, slurred speech, sleepy

## 2013-11-19 ENCOUNTER — Emergency Department (HOSPITAL_COMMUNITY)
Admission: EM | Admit: 2013-11-19 | Discharge: 2013-11-19 | Disposition: A | Discharge status: Home / Self Care | Payer: MEDICAID | Attending: Emergency Medicine | Admitting: Emergency Medicine

## 2013-11-19 ENCOUNTER — Encounter (HOSPITAL_COMMUNITY): Payer: Self-pay | Admitting: Emergency Medicine

## 2013-11-19 DIAGNOSIS — G8929 Other chronic pain: Secondary | ICD-10-CM | POA: Insufficient documentation

## 2013-11-19 DIAGNOSIS — M5430 Sciatica, unspecified side: Secondary | ICD-10-CM | POA: Insufficient documentation

## 2013-11-19 DIAGNOSIS — M545 Low back pain, unspecified: Secondary | ICD-10-CM | POA: Insufficient documentation

## 2013-11-19 DIAGNOSIS — Z79899 Other long term (current) drug therapy: Secondary | ICD-10-CM | POA: Insufficient documentation

## 2013-11-19 DIAGNOSIS — IMO0001 Reserved for inherently not codable concepts without codable children: Secondary | ICD-10-CM | POA: Insufficient documentation

## 2013-11-19 DIAGNOSIS — F17200 Nicotine dependence, unspecified, uncomplicated: Secondary | ICD-10-CM | POA: Insufficient documentation

## 2013-11-19 MED ORDER — CYCLOBENZAPRINE HCL 5 MG PO TABS: 5.0000 mg | ORAL_TABLET | Freq: Three times a day (TID) | ORAL | Status: DC | PRN

## 2013-11-19 MED ORDER — IBUPROFEN 600 MG PO TABS: 600.0000 mg | ORAL_TABLET | Freq: Four times a day (QID) | ORAL | Status: DC | PRN

## 2013-11-19 MED ORDER — OXYCODONE-ACETAMINOPHEN 5-325 MG PO TABS
2.0000 | ORAL_TABLET | Freq: Once | ORAL | Status: AC
  Administered 2013-11-19: 2 via ORAL
  Filled 2013-11-19 (×2): qty 2

## 2013-11-19 MED ORDER — KETOROLAC TROMETHAMINE 60 MG/2ML IM SOLN
60.0000 mg | Freq: Once | INTRAMUSCULAR | Status: AC
  Administered 2013-11-19: 60 mg via INTRAMUSCULAR
  Filled 2013-11-19: qty 2

## 2013-11-19 NOTE — ED Notes (Signed)
NAD noted at time of d/c home 

## 2013-11-19 NOTE — ED Notes (Signed)
Pt states "I'll be back for drugs that actually work." pt became upset that he did not get Dilaudid or narcotic prescription.

## 2013-11-19 NOTE — ED Notes (Addendum)
Patient c/o lower back pain that radiates into hip and legs bilaterally. Reports pain in back since 2011. Patient reports using "stinging in feet. Taking tylenol and ibuprofen with no relief. Per patient worse past 3-4 days. Patient states he is frustrated about not getting the help he needs.

## 2013-11-19 NOTE — ED Notes (Signed)
Pt speaking to RN in a loud pressured voice stating "I know what pain medication works for me and its not what all of you people keep giving me" pt goes on to state "I've suppose to see a pain doctor in [place] but I haven't".

## 2013-11-19 NOTE — Discharge Instructions (Signed)
Back Pain, Adult Back pain is very common. The pain often gets better over time. The cause of back pain is usually not dangerous. Most people can learn to manage their back pain on their own.  HOME CARE   Stay active. Start with short walks on flat ground if you can. Try to walk farther each day.  Do not sit, drive, or stand in one place for more than 30 minutes. Do not stay in bed.  Do not avoid exercise or work. Activity can help your back heal faster.  Be careful when you bend or lift an object. Bend at your knees, keep the object close to you, and do not twist.  Sleep on a firm mattress. Lie on your side, and bend your knees. If you lie on your back, put a pillow under your knees.  Only take medicines as told by your doctor.  Put ice on the injured area.  Put ice in a plastic bag.  Place a towel between your skin and the bag.  Leave the ice on for 15-20 minutes, 03-04 times a day for the first 2 to 3 days. After that, you can switch between ice and heat packs.  Ask your doctor about back exercises or massage.  Avoid feeling anxious or stressed. Find good ways to deal with stress, such as exercise. GET HELP RIGHT AWAY IF:   Your pain does not go away with rest or medicine.  Your pain does not go away in 1 week.  You have new problems.  You do not feel well.  The pain spreads into your legs.  You cannot control when you poop (bowel movement) or pee (urinate).  Your arms or legs feel weak or lose feeling (numbness).  You feel sick to your stomach (nauseous) or throw up (vomit).  You have belly (abdominal) pain.  You feel like you may pass out (faint). MAKE SURE YOU:   Understand these instructions.  Will watch your condition.  Will get help right away if you are not doing well or get worse. Document Released: 09/16/2007 Document Revised: 06/22/2011 Document Reviewed: 08/01/2013 [facility] Patient Information 2015 [place], [place]. This information is not intended  to replace advice given to you by your health care provider. Make sure you discuss any questions you have with your health care provider.     Avoid lifting,  Bending,  Twisting or any other activity that worsens your pain over the next week.  Apply an  icepack  to your lower back for 10-15 minutes every 2 hours for the next 2 days.  You should get rechecked if your symptoms are not better over the next 5 days,  Or you develop increased pain,  Weakness in your leg(s) or loss of bladder or bowel function - these are symptoms of a worse injury.

## 2013-11-19 NOTE — ED Provider Notes (Signed)
CSN: [id number]     Arrival date & time 11/19/13  1410 History  This chart was scribed for non-physician practitioner, Evalee Jefferson, PA-C,working with Fredia Sorrow, MD by Marlowe Kays, ED Scribe. This patient was seen in room APFT20/APFT20 and the patient's care was started at 4:12 PM.  Chief Complaint  Patient presents with  . Back Pain   Patient is a 38 y.o. male presenting with back pain. The history is provided by the patient. No language interpreter was used.  Back Pain Associated symptoms: no abdominal pain, no chest pain, no dysuria, no fever, no numbness and no weakness    HPI Comments:  Nathan Mckay is a 38 y.o. male with PMH of chronic back pain, fibromyalgia, polysubstance abuse and seizures, who presents to the Emergency Department complaining of worsening lower back pain for the past four days. He states the pain radiates down his legs and feels a stinging pain in his feet. He states he is "stuck in a cycle" that he cannot get out of stating he is trying to treat and control his pain on his own but states he cannot get a doctor that will listen to him. He states he has had Dilaudid in the past he gets "from the street" but it is expensive. 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 Pt reports he wants to get into the Methadone clinic in [place] but has to find transportation. He states he was  Previously evaluated by Dr. Tessa Lerner but the patient was not able to get the treatment he felt he needed (dilaudid) so he stopped going. He reports seeing a psychologist. He states he has tried to check into [facility] but was referred to [facility] who referred him to a Methadone Clinic in [place] but has no transportation to get there or money to pay for it. He reports he has talked to Dr. Isabelle Course office but has never set up an appt to see him. He denies any bowel or bladder  incontinence, numbness, tingling or weakness of the lower extremities. Pt states he does not have a PCP.   Past Medical History  Diagnosis Date  . Chronic headaches   . Chronic back pain   . Polysubstance abuse   . Recurrent shoulder dislocation   . Sciatica   . Fibromyalgia   . Seizures    Past Surgical History  Procedure Laterality Date  . Hand surgery    . Wrist surgery     History reviewed. No pertinent family history. History  Substance Use Topics  . Smoking status: Current Every Day Smoker -- 0.50 packs/day for 27 years    Types: Cigarettes  . Smokeless tobacco: Never Used  . Alcohol Use: No    Review of Systems  Constitutional: Negative for fever.  Respiratory: Negative for shortness of breath.   Cardiovascular: Negative for chest pain and leg swelling.  Gastrointestinal: Negative for abdominal pain, constipation and abdominal distention.  Genitourinary: Negative for dysuria, urgency, frequency, flank pain and difficulty urinating.  Musculoskeletal: Positive for back pain. Negative for gait problem and joint swelling.  Skin: Negative for rash.  Neurological: Negative for weakness and numbness.    Allergies  Nickel  Home Medications   Prior to Admission medications   Medication Sig Start Date End Date Taking? Authorizing Provider  acetaminophen (TYLENOL) 500 MG tablet Take 1,000 mg by mouth every 6 (  six) hours as needed for headache.   Yes Historical Provider, MD  cyclobenzaprine (FLEXERIL) 5 MG tablet Take 1 tablet (5 mg total) by mouth 3 (three) times daily as needed for muscle spasms. 11/19/13   Evalee Jefferson, PA-C  ibuprofen (ADVIL,MOTRIN) 600 MG tablet Take 1 tablet (600 mg total) by mouth every 6 (six) hours as needed. 11/19/13   Evalee Jefferson, PA-C  oxymetazoline (AFRIN) 0.05 % nasal spray Place 2 sprays into the nose 2 (two) times daily.    Historical Provider, MD   Triage Vitals: BP 102/78  Pulse 110  Resp 18  Ht 5\' 9"  (1.753 m)  Wt 185 lb (83.915 kg)  BMI  27.31 kg/m2  SpO2 95% Physical Exam  Nursing note and vitals reviewed. Constitutional: He appears well-developed and well-nourished.  HENT:  Head: Normocephalic.  Eyes: Conjunctivae are normal.  Neck: Normal range of motion. Neck supple.  Cardiovascular: Normal rate and intact distal pulses.   Pedal pulses normal.  Pulmonary/Chest: Effort normal.  Abdominal: Soft. Bowel sounds are normal. He exhibits no distension and no mass.  Musculoskeletal: Normal range of motion. He exhibits no edema.       Lumbar back: He exhibits tenderness. He exhibits no swelling, no edema and no spasm.  Neurological: He is alert. He has normal strength. He displays no atrophy and no tremor. No sensory deficit. Gait normal.  Reflex Scores:      Patellar reflexes are 2+ on the right side and 2+ on the left side.      Achilles reflexes are 2+ on the right side and 2+ on the left side. No strength deficit noted in hip and knee flexor and extensor muscle groups.  Ankle flexion and extension intact.  Skin: Skin is warm and dry.  Psychiatric: He has a normal mood and affect.    ED Course  Procedures (including critical care time) DIAGNOSTIC STUDIES: Oxygen Saturation is 95% on RA, adequate by my interpretation.   COORDINATION OF CARE: 4:26 PM- Advised pt he will not be prescribed any narcotics. Pt verbalizes understanding and agrees to plan.  Medications  ketorolac (TORADOL) injection 60 mg (60 mg Intramuscular Given 11/19/13 1648)  oxyCODONE-acetaminophen (PERCOCET/ROXICET) 5-325 MG per tablet 2 tablet (2 tablets Oral Given 11/19/13 1648)    Labs Review Labs Reviewed - No data to display  Imaging Review No results found.   EKG Interpretation None      MDM   Final diagnoses:  Chronic lower back pain    Patients labs and/or radiological studies were viewed and considered during the medical decision making and disposition process. Informed this patient he will not be getting narcotic prescriptions  from the ed.  He was given a toradol injection and percocet tablets#2.  He was given referrals for f/u care including for primary care.  He was encouraged to pursue seeing Dr. Francesco Runner who perhaps can help with his chronic pain issues.  No neuro deficit on exam or by history to suggest emergent or surgical presentation.  Also discussed worsened sx that should prompt immediate re-evaluation including distal weakness, bowel/bladder retention/incontinence.  I personally performed the services described in this documentation, which was scribed in my presence. The recorded information has been reviewed and is accurate.    Evalee Jefferson, PA-C 11/19/13 2133

## 2013-11-20 NOTE — ED Provider Notes (Signed)
Medical screening examination/treatment/procedure(s) were performed by non-physician practitioner and as supervising physician I was immediately available for consultation/collaboration.   EKG Interpretation None        Fredia Sorrow, MD 11/20/13 [phone]

## 2014-03-14 ENCOUNTER — Telehealth: Payer: Self-pay | Admitting: Family Medicine

## 2014-03-14 NOTE — Telephone Encounter (Signed)
Patient advised we have no appointments available at this time and that he could check back after the 1st of the year.

## 2014-03-23 ENCOUNTER — Encounter (HOSPITAL_COMMUNITY): Payer: Self-pay | Admitting: Emergency Medicine

## 2014-03-23 ENCOUNTER — Emergency Department (HOSPITAL_COMMUNITY)
Admission: EM | Admit: 2014-03-23 | Discharge: 2014-03-23 | Disposition: A | Discharge status: Home / Self Care | Payer: MEDICAID | Attending: Emergency Medicine | Admitting: Emergency Medicine

## 2014-03-23 DIAGNOSIS — Z79899 Other long term (current) drug therapy: Secondary | ICD-10-CM | POA: Insufficient documentation

## 2014-03-23 DIAGNOSIS — M5430 Sciatica, unspecified side: Secondary | ICD-10-CM | POA: Insufficient documentation

## 2014-03-23 DIAGNOSIS — M62830 Muscle spasm of back: Secondary | ICD-10-CM | POA: Insufficient documentation

## 2014-03-23 DIAGNOSIS — G8929 Other chronic pain: Secondary | ICD-10-CM | POA: Insufficient documentation

## 2014-03-23 DIAGNOSIS — Z720 Tobacco use: Secondary | ICD-10-CM | POA: Insufficient documentation

## 2014-03-23 DIAGNOSIS — M797 Fibromyalgia: Secondary | ICD-10-CM | POA: Insufficient documentation

## 2014-03-23 DIAGNOSIS — M549 Dorsalgia, unspecified: Secondary | ICD-10-CM

## 2014-03-23 DIAGNOSIS — M545 Low back pain: Secondary | ICD-10-CM | POA: Insufficient documentation

## 2014-03-23 DIAGNOSIS — R51 Headache: Secondary | ICD-10-CM | POA: Insufficient documentation

## 2014-03-23 MED ORDER — ORPHENADRINE CITRATE ER 100 MG PO TB12: 100.0000 mg | ORAL_TABLET | Freq: Two times a day (BID) | ORAL | Status: DC

## 2014-03-23 MED ORDER — DICLOFENAC SODIUM 75 MG PO TBEC: 75.0000 mg | DELAYED_RELEASE_TABLET | Freq: Two times a day (BID) | ORAL | Status: DC

## 2014-03-23 MED ORDER — INDOMETHACIN 25 MG PO CAPS
25.0000 mg | ORAL_CAPSULE | Freq: Once | ORAL | Status: AC
  Administered 2014-03-23: 25 mg via ORAL
  Filled 2014-03-23: qty 1

## 2014-03-23 MED ORDER — DIAZEPAM 5 MG PO TABS
5.0000 mg | ORAL_TABLET | Freq: Once | ORAL | Status: AC
Start: 2014-03-23 — End: 2014-03-23
  Administered 2014-03-23: 5 mg via ORAL
  Filled 2014-03-23: qty 1

## 2014-03-23 NOTE — ED Notes (Signed)
Pt reports chronic back pain flare-up x1 week. Pt reports back pain has gotten more intense after helping move a refrigerator. nad noted.

## 2014-03-23 NOTE — Discharge Instructions (Signed)
Chronic Back Pain  NO HEAVY LIFTING, PULLING OR STRAINING. SEE PAIN MANAGEMENT MD AS PLANNED. EMERGENCY DEPT. IS NOT SET UP FOR CHRONIC PAIN MANAGEMENT. NORFLEX MAY CAUSE DROWSINESS, TAKE DICLOFENAC WITH FOOD.                                                                                     When back pain lasts longer than 3 months, it is called chronic back pain.People with chronic back pain often go through certain periods that are more intense (flare-ups).  CAUSES Chronic back pain can be caused by wear and tear (degeneration) on different structures in your back. These structures include:  The bones of your spine (vertebrae) and the joints surrounding your spinal cord and nerve roots (facets).  The strong, fibrous tissues that connect your vertebrae (ligaments). Degeneration of these structures may result in pressure on your nerves. This can lead to constant pain. HOME CARE INSTRUCTIONS  Avoid bending, heavy lifting, prolonged sitting, and activities which make the problem worse.  Take brief periods of rest throughout the day to reduce your pain. Lying down or standing usually is better than sitting while you are resting.  Take over-the-counter or prescription medicines only as directed by your caregiver. SEEK IMMEDIATE MEDICAL CARE IF:   You have weakness or numbness in one of your legs or feet.  You have trouble controlling your bladder or bowels.  You have nausea, vomiting, abdominal pain, shortness of breath, or fainting. Document Released: 05/07/2004 Document Revised: 06/22/2011 Document Reviewed: 03/14/2011 [facility] Patient Information 2015 [place], [place]. This information is not intended to replace advice given to you by your health care provider. Make sure you discuss any questions you have with your health care provider.

## 2014-03-23 NOTE — ED Provider Notes (Signed)
CSN: [id number]     Arrival date & time 03/23/14  1729 History   First MD Initiated Contact with Patient 03/23/14 1734     Chief Complaint  Patient presents with  . Back Pain     (Consider location/radiation/quality/duration/timing/severity/associated sxs/prior Treatment) Patient is a 38 y.o. male presenting with back pain. The history is provided by the patient.  Back Pain Location:  Lumbar spine Quality:  Aching and shooting Pain severity:  Severe Pain is:  Same all the time Duration:  1 week Timing:  Intermittent Progression:  Worsening Chronicity: acute on chronic. Context: lifting heavy objects   Context comment:  Hx of chronic back pain Relieved by:  Nothing Worsened by:  Movement Ineffective treatments:  None tried Associated symptoms: no abdominal pain, no bladder incontinence, no bowel incontinence, no chest pain, no dysuria and no paresthesias   Risk factors: no recent surgery     Past Medical History  Diagnosis Date  . Chronic headaches   . Chronic back pain   . Polysubstance abuse   . Recurrent shoulder dislocation   . Sciatica   . Fibromyalgia   . Seizures    Past Surgical History  Procedure Laterality Date  . Hand surgery    . Wrist surgery     History reviewed. No pertinent family history. History  Substance Use Topics  . Smoking status: Current Every Day Smoker -- 0.50 packs/day for 27 years    Types: Cigarettes  . Smokeless tobacco: Never Used  . Alcohol Use: No    Review of Systems  Constitutional: Negative for activity change.       All ROS Neg except as noted in HPI  HENT: Negative for nosebleeds.   Eyes: Negative for photophobia and discharge.  Respiratory: Negative for cough, shortness of breath and wheezing.   Cardiovascular: Negative for chest pain and palpitations.  Gastrointestinal: Negative for abdominal pain, blood in stool and bowel incontinence.  Genitourinary: Negative for bladder incontinence, dysuria, frequency and  hematuria.  Musculoskeletal: Positive for back pain. Negative for arthralgias and neck pain.  Skin: Negative.   Neurological: Positive for seizures. Negative for dizziness, speech difficulty and paresthesias.  Psychiatric/Behavioral: Negative for hallucinations and confusion.      Allergies  Nickel  Home Medications   Prior to Admission medications   Medication Sig Start Date End Date Taking? Authorizing Provider  acetaminophen (TYLENOL) 500 MG tablet Take 1,000 mg by mouth every 6 (six) hours as needed for headache.    Historical Provider, MD  cyclobenzaprine (FLEXERIL) 5 MG tablet Take 1 tablet (5 mg total) by mouth 3 (three) times daily as needed for muscle spasms. 11/19/13   Evalee Jefferson, PA-C  ibuprofen (ADVIL,MOTRIN) 600 MG tablet Take 1 tablet (600 mg total) by mouth every 6 (six) hours as needed. 11/19/13   Evalee Jefferson, PA-C  oxymetazoline (AFRIN) 0.05 % nasal spray Place 2 sprays into the nose 2 (two) times daily.    Historical Provider, MD   BP 113/73 mmHg  Pulse 85  Temp(Src) 98.3 F (36.8 C) (Oral)  Resp 18  Ht 5\' 9"  (1.753 m)  Wt 185 lb (83.915 kg)  BMI 27.31 kg/m2  SpO2 99% Physical Exam  Constitutional: He is oriented to person, place, and time. He appears well-developed and well-nourished.  Non-toxic appearance.  HENT:  Head: Normocephalic.  Right Ear: Tympanic membrane and external ear normal.  Left Ear: Tympanic membrane and external ear normal.  Eyes: EOM and lids are normal. Pupils are equal, round, and  reactive to light.  Neck: Normal range of motion. Neck supple. Carotid bruit is not present.  Cardiovascular: Normal rate, regular rhythm, normal heart sounds, intact distal pulses and normal pulses.   Pulmonary/Chest: Breath sounds normal. No respiratory distress.  Abdominal: Soft. Bowel sounds are normal. There is no tenderness. There is no guarding.  Musculoskeletal: Normal range of motion.       Lumbar back: He exhibits tenderness, pain and spasm. He exhibits  normal range of motion.  Lymphadenopathy:       Head (right side): No submandibular adenopathy present.       Head (left side): No submandibular adenopathy present.    He has no cervical adenopathy.  Neurological: He is alert and oriented to person, place, and time. He has normal strength. No cranial nerve deficit or sensory deficit.  Skin: Skin is warm and dry.  Psychiatric: He has a normal mood and affect. His speech is normal.  Nursing note and vitals reviewed.   ED Course  Procedures (including critical care time) Labs Review Labs Reviewed - No data to display  Imaging Review No results found.   EKG Interpretation None      MDM  This is one of several visits for chronic back pain. Patient states that he was helping to move a heavy refrigerator when he aggravated his chronic pain. He further states that he has been put out of the pain management program from his current neurologist. He is scheduled to see a pain specialist in [place] and possibly one in [place] soon, but states he just could not take the pain anymore. No gross neurologic deficits appreciated on examination tonight. I discussed with the patient the need to follow-up with the pain specialist. I discussed with the patient that the emergency department is happy to assist with emergent situations, but not set up for chronic pain. Prescription for Norflex and diclofenac given to the patient.    Final diagnoses:  Chronic back pain    *I have reviewed nursing notes, vital signs, and all appropriate lab and imaging results for this patient.[address] PA-C 03/23/14 [place], MD 03/24/14 [phone]

## 2014-05-14 ENCOUNTER — Emergency Department (HOSPITAL_COMMUNITY)
Admission: EM | Admit: 2014-05-14 | Discharge: 2014-05-15 | Disposition: A | Discharge status: Home / Self Care | Payer: MEDICAID | Attending: Emergency Medicine | Admitting: Emergency Medicine

## 2014-05-14 ENCOUNTER — Encounter (HOSPITAL_COMMUNITY): Payer: Self-pay | Admitting: *Deleted

## 2014-05-14 DIAGNOSIS — M545 Low back pain, unspecified: Secondary | ICD-10-CM

## 2014-05-14 DIAGNOSIS — R319 Hematuria, unspecified: Secondary | ICD-10-CM

## 2014-05-14 DIAGNOSIS — G8929 Other chronic pain: Secondary | ICD-10-CM | POA: Insufficient documentation

## 2014-05-14 DIAGNOSIS — Z720 Tobacco use: Secondary | ICD-10-CM | POA: Insufficient documentation

## 2014-05-14 DIAGNOSIS — Z791 Long term (current) use of non-steroidal anti-inflammatories (NSAID): Secondary | ICD-10-CM | POA: Insufficient documentation

## 2014-05-14 DIAGNOSIS — Z79899 Other long term (current) drug therapy: Secondary | ICD-10-CM | POA: Insufficient documentation

## 2014-05-14 DIAGNOSIS — M797 Fibromyalgia: Secondary | ICD-10-CM | POA: Insufficient documentation

## 2014-05-14 DIAGNOSIS — Z7951 Long term (current) use of inhaled steroids: Secondary | ICD-10-CM | POA: Insufficient documentation

## 2014-05-14 MED ORDER — LORAZEPAM 2 MG/ML IJ SOLN
1.0000 mg | Freq: Once | INTRAMUSCULAR | Status: AC
  Administered 2014-05-14: 1 mg via INTRAMUSCULAR
  Filled 2014-05-14: qty 1

## 2014-05-14 MED ORDER — HYDROMORPHONE HCL 1 MG/ML IJ SOLN
1.0000 mg | Freq: Once | INTRAMUSCULAR | Status: AC
  Administered 2014-05-14: 1 mg via INTRAMUSCULAR
  Filled 2014-05-14: qty 1

## 2014-05-14 NOTE — ED Notes (Signed)
Low back pain, hematuria "alot of stress"

## 2014-05-14 NOTE — ED Provider Notes (Signed)
CSN: [id number]     Arrival date & time 05/14/14  2253 History  This chart was scribed for Maudry Diego, MD by Chester Holstein, ED Scribe. This patient was seen in room APA01/APA01 and the patient's care was started at 11:25 PM.    Chief Complaint  Patient presents with  . Hematuria   Patient is a 39 y.o. male presenting with hematuria. The history is provided by the patient. No language interpreter was used.  Hematuria This is a chronic problem. The problem occurs constantly. The problem has not changed since onset.Pertinent negatives include no chest pain, no abdominal pain and no headaches. Nothing relieves the symptoms.    HPI Comments: Nathan Mckay is a 39 y.o. male with PMHx of chronic headaches, chronic back pain, polysubstance abuse, sciatica, fibromyalgia, and seizures who presents to the Emergency Department complaining of chronic lower back pain. Pt states pain radiates to LE. Pt notes associated hematuria. Pt notes nothing helps the pain. Pt anxious in exam room. Pt denies any other problems currently and suicidal ideation.  Pt notes he currently does not have a PCP.   Past Medical History  Diagnosis Date  . Chronic headaches   . Chronic back pain   . Polysubstance abuse   . Recurrent shoulder dislocation   . Sciatica   . Fibromyalgia   . Seizures    Past Surgical History  Procedure Laterality Date  . Hand surgery    . Wrist surgery     History reviewed. No pertinent family history. History  Substance Use Topics  . Smoking status: Current Every Day Smoker -- 0.50 packs/day for 27 years    Types: Cigarettes  . Smokeless tobacco: Never Used  . Alcohol Use: No    Review of Systems  Constitutional: Negative for appetite change and fatigue.  HENT: Negative for congestion, ear discharge and sinus pressure.   Eyes: Negative for discharge.  Respiratory: Negative for cough.   Cardiovascular: Negative for chest pain.  Gastrointestinal: Negative for abdominal pain  and diarrhea.  Genitourinary: Positive for hematuria. Negative for frequency.  Musculoskeletal: Positive for myalgias and back pain.  Skin: Negative for rash.  Neurological: Negative for seizures and headaches.  Psychiatric/Behavioral: Negative for suicidal ideas and hallucinations.      Allergies  Nickel  Home Medications   Prior to Admission medications   Medication Sig Start Date End Date Taking? Authorizing Provider  acetaminophen (TYLENOL) 500 MG tablet Take 1,000 mg by mouth every 6 (six) hours as needed for headache.    Historical Provider, MD  cyclobenzaprine (FLEXERIL) 5 MG tablet Take 1 tablet (5 mg total) by mouth 3 (three) times daily as needed for muscle spasms. 11/19/13   Evalee Jefferson, PA-C  diclofenac (VOLTAREN) 75 MG EC tablet Take 1 tablet (75 mg total) by mouth 2 (two) times daily. 03/23/14   Lenox Ahr, PA-C  ibuprofen (ADVIL,MOTRIN) 600 MG tablet Take 1 tablet (600 mg total) by mouth every 6 (six) hours as needed. 11/19/13   Evalee Jefferson, PA-C  orphenadrine (NORFLEX) 100 MG tablet Take 1 tablet (100 mg total) by mouth 2 (two) times daily. 03/23/14   Lenox Ahr, PA-C  oxymetazoline (AFRIN) 0.05 % nasal spray Place 2 sprays into the nose 2 (two) times daily.    Historical Provider, MD   BP 111/77 mmHg  Pulse 98  Temp(Src) 98.5 F (36.9 C) (Oral)  Resp 20  Ht 5\' 9"  (1.753 m)  Wt 190 lb (86.183 kg)  BMI 28.05  kg/m2  SpO2 98% Physical Exam  Constitutional: He is oriented to person, place, and time. He appears well-developed.  HENT:  Head: Normocephalic.  Eyes: Conjunctivae and EOM are normal. No scleral icterus.  Neck: Neck supple. No thyromegaly present.  Cardiovascular: Normal rate and regular rhythm.  Exam reveals no gallop and no friction rub.   No murmur heard. Pulmonary/Chest: No stridor. He has no wheezes. He has no rales. He exhibits no tenderness.  Abdominal: He exhibits no distension. There is no tenderness. There is no rebound.  Musculoskeletal:  Normal range of motion. He exhibits no edema.       Lumbar back: He exhibits bony tenderness (mild).  Lymphadenopathy:    He has no cervical adenopathy.  Neurological: He is oriented to person, place, and time. He exhibits normal muscle tone. Coordination normal.  Skin: No rash noted. No erythema.  Psychiatric: He has a normal mood and affect. His behavior is normal.  Nursing note and vitals reviewed.   ED Course  Procedures (including critical care time) DIAGNOSTIC STUDIES: Oxygen Saturation is 98% on room air, normal by my interpretation.    COORDINATION OF CARE: 11:28 PM Discussed treatment plan with patient at beside, the patient agrees with the plan and has no further questions at this time.   Labs Review Labs Reviewed - No data to display  Imaging Review No results found.   EKG Interpretation None      MDM   Final diagnoses:  None  uti and chronic back pain.   tx with keflex and dilaudid and follow up with urology, [facility] [place]  The chart was scribed for me under my direct supervision.  I personally performed the history, physical, and medical decision making and all procedures in the evaluation of this patient.Maudry Diego, MD 05/15/14 [phone]

## 2014-05-15 LAB — URINALYSIS, ROUTINE W REFLEX MICROSCOPIC
Bilirubin Urine: NEGATIVE
Glucose, UA: NEGATIVE mg/dL
Ketones, ur: NEGATIVE mg/dL
Leukocytes, UA: NEGATIVE
NITRITE: NEGATIVE
Protein, ur: NEGATIVE mg/dL
Specific Gravity, Urine: >1.03 — ABNORMAL HIGH (ref 1.005–1.030)
Urobilinogen, UA: 0.2 mg/dL (ref 0.0–1.0)
pH: 6 (ref 5.0–8.0)

## 2014-05-15 LAB — URINE MICROSCOPIC-ADD ON

## 2014-05-15 MED ORDER — HYDROMORPHONE HCL 4 MG PO TABS: 4.0000 mg | ORAL_TABLET | Freq: Four times a day (QID) | ORAL | Status: DC | PRN

## 2014-05-15 MED ORDER — CEPHALEXIN 500 MG PO CAPS
500.0000 mg | ORAL_CAPSULE | Freq: Once | ORAL | Status: AC
  Administered 2014-05-15: 500 mg via ORAL
  Filled 2014-05-15: qty 1

## 2014-05-15 MED ORDER — CEPHALEXIN 500 MG PO CAPS: 500.0000 mg | ORAL_CAPSULE | Freq: Four times a day (QID) | ORAL | Status: DC

## 2014-05-15 NOTE — Discharge Instructions (Signed)
Follow up with [facility],   Follow up with dr. Merlene Laughter,  Follow up with [facility]

## 2014-05-15 NOTE — ED Notes (Signed)
Patient verbalized understanding of discharge instructions. Attempting to call for ride home. Instructed patient not to drive home after receiving medications. Patient stated did not drive to ED and will not be driving home.

## 2014-05-16 LAB — URINE CULTURE

## 2014-06-18 ENCOUNTER — Encounter (HOSPITAL_COMMUNITY): Payer: Self-pay | Admitting: *Deleted

## 2014-06-18 ENCOUNTER — Emergency Department (HOSPITAL_COMMUNITY)
Admission: EM | Admit: 2014-06-18 | Discharge: 2014-06-18 | Disposition: A | Discharge status: Home / Self Care | Payer: Self-pay | Attending: Emergency Medicine | Admitting: Emergency Medicine

## 2014-06-18 DIAGNOSIS — Z720 Tobacco use: Secondary | ICD-10-CM | POA: Insufficient documentation

## 2014-06-18 DIAGNOSIS — Z792 Long term (current) use of antibiotics: Secondary | ICD-10-CM | POA: Insufficient documentation

## 2014-06-18 DIAGNOSIS — Z87828 Personal history of other (healed) physical injury and trauma: Secondary | ICD-10-CM | POA: Insufficient documentation

## 2014-06-18 DIAGNOSIS — M545 Low back pain: Secondary | ICD-10-CM | POA: Insufficient documentation

## 2014-06-18 DIAGNOSIS — M549 Dorsalgia, unspecified: Secondary | ICD-10-CM

## 2014-06-18 DIAGNOSIS — Z791 Long term (current) use of non-steroidal anti-inflammatories (NSAID): Secondary | ICD-10-CM | POA: Insufficient documentation

## 2014-06-18 DIAGNOSIS — G8929 Other chronic pain: Secondary | ICD-10-CM | POA: Insufficient documentation

## 2014-06-18 MED ORDER — CYCLOBENZAPRINE HCL 10 MG PO TABS: 10.0000 mg | ORAL_TABLET | Freq: Three times a day (TID) | ORAL | Status: DC | PRN

## 2014-06-18 MED ORDER — CYCLOBENZAPRINE HCL 10 MG PO TABS
10.0000 mg | ORAL_TABLET | Freq: Once | ORAL | Status: AC
  Administered 2014-06-18: 10 mg via ORAL
  Filled 2014-06-18: qty 1

## 2014-06-18 MED ORDER — TRAMADOL HCL 50 MG PO TABS
50.0000 mg | ORAL_TABLET | Freq: Four times a day (QID) | ORAL | Status: DC | PRN
Start: 2014-06-18 — End: 2014-07-25

## 2014-06-18 MED ORDER — PREDNISONE 10 MG PO TABS: ORAL_TABLET | ORAL | Status: DC

## 2014-06-18 MED ORDER — OXYCODONE-ACETAMINOPHEN 5-325 MG PO TABS
1.0000 | ORAL_TABLET | Freq: Once | ORAL | Status: AC
  Administered 2014-06-18: 1 via ORAL
  Filled 2014-06-18: qty 1

## 2014-06-18 MED ORDER — IBUPROFEN 800 MG PO TABS
800.0000 mg | ORAL_TABLET | Freq: Once | ORAL | Status: AC
  Administered 2014-06-18: 800 mg via ORAL
  Filled 2014-06-18: qty 1

## 2014-06-18 NOTE — ED Provider Notes (Signed)
CSN: [id number]     Arrival date & time 06/18/14  1606 History  This chart was scribed for non-physician practitioner, Kem Parkinson, PA-C working with Fredia Sorrow, MD, by Erling Conte, ED Scribe. This patient was seen in room APFT21/APFT21 and the patient's care was started at 5:33 PM.   Chief Complaint  Patient presents with  . Back Pain    The history is provided by the patient. No language interpreter was used.    HPI Comments: Nathan Mckay is a 39 y.o. male with a h/o chronic back pain, polysubstance abuse, fibromyalgia, sciatica, and seizures, who presents to the Emergency Department complaining of constant, moderate, low back pain. This is a chronic issue and he states that this has been an issue for 16 years. He states the pain radiates down his bilateral legs to his feet. Pt has been in the ED previously for similar symptoms. He states he has taken Dilaudid in the past with relief. He has had an MRI done before with no acute findings. Pt has been in pain clinic in the past but is not currently  .He denies any dysuria, nausea, vomiting, fever, urinary or bowel incontinence.   Past Medical History  Diagnosis Date  . Chronic headaches   . Chronic back pain   . Polysubstance abuse   . Recurrent shoulder dislocation   . Sciatica   . Fibromyalgia   . Seizures    Past Surgical History  Procedure Laterality Date  . Hand surgery    . Wrist surgery     History reviewed. No pertinent family history. History  Substance Use Topics  . Smoking status: Current Every Day Smoker -- 0.50 packs/day for 27 years    Types: Cigarettes  . Smokeless tobacco: Never Used  . Alcohol Use: No    Review of Systems  Constitutional: Negative for fever and chills.  Respiratory: Negative for shortness of breath.   Gastrointestinal: Negative for nausea, vomiting, abdominal pain and constipation.       No bowel incontinence  Genitourinary: Negative for dysuria, hematuria, flank pain,  decreased urine volume and difficulty urinating.       No urinary incontinence  Musculoskeletal: Positive for back pain. Negative for joint swelling.  Skin: Negative for rash.  Neurological: Negative for weakness and numbness.  All other systems reviewed and are negative.   Allergies  Nickel  Home Medications   Prior to Admission medications   Medication Sig Start Date End Date Taking? Authorizing Provider  acetaminophen (TYLENOL) 500 MG tablet Take 1,000 mg by mouth every 6 (six) hours as needed for headache.    Historical Provider, MD  cephALEXin (KEFLEX) 500 MG capsule Take 1 capsule (500 mg total) by mouth 4 (four) times daily. 05/15/14   Milton Ferguson, MD  cyclobenzaprine (FLEXERIL) 5 MG tablet Take 1 tablet (5 mg total) by mouth 3 (three) times daily as needed for muscle spasms. 11/19/13   Evalee Jefferson, PA-C  diclofenac (VOLTAREN) 75 MG EC tablet Take 1 tablet (75 mg total) by mouth 2 (two) times daily. 03/23/14   Lily Kocher, PA-C  HYDROmorphone (DILAUDID) 4 MG tablet Take 1 tablet (4 mg total) by mouth every 6 (six) hours as needed for severe pain. 05/15/14   Milton Ferguson, MD  ibuprofen (ADVIL,MOTRIN) 600 MG tablet Take 1 tablet (600 mg total) by mouth every 6 (six) hours as needed. 11/19/13   Evalee Jefferson, PA-C  orphenadrine (NORFLEX) 100 MG tablet Take 1 tablet (100 mg total) by mouth 2 (  two) times daily. 03/23/14   Lily Kocher, PA-C  oxymetazoline (AFRIN) 0.05 % nasal spray Place 2 sprays into the nose 2 (two) times daily.    Historical Provider, MD   Triage Vitals: BP 109/75 mmHg  Temp(Src) 98.4 F (36.9 C) (Oral)  Resp 14  Ht 5\' 9"  (1.753 m)  Wt 190 lb (86.183 kg)  BMI 28.05 kg/m2  SpO2 100%  Physical Exam  Constitutional: He is oriented to person, place, and time. He appears well-developed and well-nourished. No distress.  HENT:  Head: Normocephalic and atraumatic.  Eyes: Conjunctivae and EOM are normal.  Neck: Neck supple. No tracheal deviation present.  Cardiovascular:  Normal rate, regular rhythm and normal heart sounds.  Exam reveals no gallop and no friction rub.   No murmur heard. Pulmonary/Chest: Effort normal and breath sounds normal. No respiratory distress.  Musculoskeletal: Normal range of motion.  Pt reports diffuse tenderness of the lower back, no spinal tenderness on exam. Pt is sitting with legs folded on the stretcher  Neurological: He is alert and oriented to person, place, and time. He has normal strength. No sensory deficit. Gait normal.  Reflex Scores:      Patellar reflexes are 2+ on the right side and 2+ on the left side.      Achilles reflexes are 2+ on the right side and 2+ on the left side. Skin: Skin is warm and dry.  Psychiatric: He has a normal mood and affect. His behavior is normal.  Nursing note and vitals reviewed.   ED Course  Procedures (including critical care time)  DIAGNOSTIC STUDIES: Oxygen Saturation is 100% on RA, normal by my interpretation.    COORDINATION OF CARE: 5:50 PM- will discharge pt home with Flexeril,Tramadol and Prednisone. Pt advised of plan for treatment and pt agrees.     Labs Review Labs Reviewed - No data to display  Imaging Review No results found.   EKG Interpretation None      MDM   Upon entering room pt requesting pain medication stating that Narcotics are only thing that helps with his pain. He got Dilaudid at last visit which helped control his symptoms and he requesting another prescription.  Patient has chronic low back pain and I have explained in detail that narcotics are not indicated for his symptoms at this time.  Referral information given so that he can establish primary care.  Final diagnoses:  Chronic back pain   Patient ambulated in the dept without difficulty, vitals stable.  Pt has a nml exam, and I have a high clinical suspicion for drug seeking behavior.  Patient understands that he needs to arrange f/u with pain management or PMD for management of his chronic  sx's.   I personally performed the services described in this documentation, which was scribed in my presence. The recorded information has been reviewed and is accurate.      Patrice Paradise, PA-C 06/20/14 [phone]  Fredia Sorrow, MD 06/28/14 [phone]

## 2014-06-18 NOTE — Discharge Instructions (Signed)
Chronic Back Pain ° When back pain lasts longer than 3 months, it is called chronic back pain. People with chronic back pain often go through certain periods that are more intense (flare-ups).  °CAUSES °Chronic back pain can be caused by wear and tear (degeneration) on different structures in your back. These structures include: °· The bones of your spine (vertebrae) and the joints surrounding your spinal cord and nerve roots (facets). °· The strong, fibrous tissues that connect your vertebrae (ligaments). °Degeneration of these structures may result in pressure on your nerves. This can lead to constant pain. °HOME CARE INSTRUCTIONS °· Avoid bending, heavy lifting, prolonged sitting, and activities which make the problem worse. °· Take brief periods of rest throughout the day to reduce your pain. Lying down or standing usually is better than sitting while you are resting. °· Take over-the-counter or prescription medicines only as directed by your caregiver. °SEEK IMMEDIATE MEDICAL CARE IF:  °· You have weakness or numbness in one of your legs or feet. °· You have trouble controlling your bladder or bowels. °· You have nausea, vomiting, abdominal pain, shortness of breath, or fainting. °Document Released: 05/07/2004 Document Revised: 06/22/2011 Document Reviewed: 03/14/2011 °ExitCare® Patient Information ©2015 [employer]. This information is not intended to replace advice given to you by your health care provider. Make sure you discuss any questions you have with your health care provider. ° ° °Emergency Department Resource Guide °1) Find a Doctor and Pay Out of Pocket °Although you won't have to find out who is covered by your insurance plan, it is a good idea to ask around and get recommendations. You will then need to call the office and see if the doctor you have chosen will accept you as a new patient and what types of options they offer for patients who are self-pay. Some doctors offer discounts or will set  up payment plans for their patients who do not have insurance, but you will need to ask so you aren't surprised when you get to your appointment. ° °2) Contact Your Local Health Department °Not all health departments have doctors that can see patients for sick visits, but many do, so it is worth a call to see if yours does. If you don't know where your local health department is, you can check in your phone book. The CDC also has a tool to help you locate your state's health department, and many state websites also have listings of all of their local health departments. ° °3) Find a Walk-in Clinic °If your illness is not likely to be very severe or complicated, you may want to try a walk in clinic. These are popping up all over the country in pharmacies, drugstores, and shopping centers. They're usually staffed by nurse practitioners or physician assistants that have been trained to treat common illnesses and complaints. They're usually fairly quick and inexpensive. However, if you have serious medical issues or chronic medical problems, these are probably not your best option. ° °No Primary Care Doctor: °- Call [facility] at  832-8000 - they can help you locate a primary care doctor that  accepts your insurance, provides certain services, etc. °- Physician Referral Service- 1-800-533-3463 ° °Chronic Pain Problems: °Organization         Address  Phone   Notes  °[facility]  (336) 297-2271 Patients need to be referred by their primary care doctor.  ° °Medication Assistance: °Organization         Address    Phone   Notes  °[place] [facility] [address] °[place], [place] [address] (336) 641-8030 --Must be a resident of [place] °-- Must have NO insurance coverage whatsoever (no Medicaid/ Medicare, etc.) °-- The pt. MUST have a primary care doctor that directs their care regularly and follows them in the community °  °[facility]  (866) 331-1348    °[employer]  (888) 892-1162   ° °Agencies that provide inexpensive medical care: °Organization         Address  Phone   Notes  °[facility]  (336) 832-8035   °[facility]    (336) 832-7272   °[facility] [address] °[place], [place] [address] (336) 832-4777   °[facility] [address], °[place] (336) 271-4999   °[facility]    (336) 373-0678   °[facility]    (336) 272-1050   °[facility] ° [address], [place] Phone:  (336) 832-4444, Fax:  (336) 832-4440 Hours of Operation:  9 am - 6 pm, M-F.  Also accepts Medicaid/Medicare and self-pay.  °[facility] ° [address], [place] Phone: (336) 832-3150, Fax: (336) 832-3151. Hours of Operation:  8:30 am - 5:30 pm, M-F.  Also accepts Medicaid and self-pay.  °[facility] [place] [address], [place] Phone: (336) 878-6027   °[facility] [address], [place], [place] (336)723-1848, Ext. 123 Mondays & Thursdays: 7-9 AM.  First 15 patients are seen on a first come, first serve basis. °  ° °Medicaid-accepting [place] Providers: ° °Organization         Address  Phone   Notes  °[facility] [address], [place] (336) 641-2100 Also accepts self-pay patients.  °[facility] [address], [place] ° (336) 856-9996   °[facility] [address], [place] (336) 288-8857   °[facility] [address] [place] [address], [place] (336) 299-7000   °Veita Bland [address], [place]  ° (336) 373-1557 Only accepts [place] Access Medicaid patients after they have their name applied to their card.  ° °Self-Pay (no insurance) in [place]: ° °Organization         Address  Phone   Notes  °Sickle Cell Patients, [facility] [address],  [place] (336) 832-1970   °[facility] [address], [place] (336) 832-4400   °[facility] ° [address], [place] (336) 992-4800   °[facility]/Dr. Osei-Bonsu ° [address] [place] [address], [place] or [address], [place] (336) 841-8500 Phone number for both [place] and [place] locations is the same.  °[facility] [address], [place] (336) 299-0000   °[facility] [address] [place] [address],  or [address] (336) 852-7530 °(336) 878-2260   °[facility] [address],  (336) 350-1642, phone; (336) 294-5005, fax Sees patients 1st and 3rd Saturday of every month.  Must not qualify for public or private insurance (i.e. Medicaid, Medicare, [place] Health Choice, Veterans' Benefits) • Household income should be no more than 200% of the poverty level •The clinic cannot treat you if you are pregnant or think you are pregnant •   Sexually transmitted diseases are not treated at the clinic.  ° ° °Dental Care: °Organization         Address  Phone  Notes  °[facility] [address], [place] (336) 641-6152 Accepts children up to age 21 who are enrolled in Medicaid or [place] Health Choice; pregnant women with a Medicaid card; and children who have applied for Medicaid or [place] Health Choice, but were declined, whose parents can pay a reduced fee at time of service.  °[facility] [place]  [address], [place] (336) 641-7733 Accepts children up to age 21 who are enrolled in Medicaid or [place] Health Choice; pregnant women with a Medicaid card; and children who have applied for Medicaid or [place] Health Choice, but were declined, whose parents can pay a reduced fee at time of service.  °[facility] ° [address], [place] (336)  641-4533 Patients are seen by appointment only. Walk-ins are not accepted. [facility] will see patients 18 years of age and older. °Monday - Tuesday (8am-5pm) °Most Wednesdays (8:30-5pm) °$30 per visit, cash only  °[facility] ° [address], [place] (336) 641-4533 Patients are seen by appointment only. Walk-ins are not accepted. [facility] will see patients 18 years of age and older. °One Wednesday Evening (Monthly: Volunteer Based).  $30 per visit, cash only  °[facility]  (919) 537-3737 for adults; Children under age 4, call Graduate Pediatric Dentistry at (919) [phone]. Children aged 4-14, please call (919) 537-3737 to request a pediatric application. ° Dental services are provided in all areas of dental care including fillings, crowns and bridges, complete and partial dentures, implants, gum treatment, root canals, and extractions. Preventive care is also provided. Treatment is provided to both adults and children. °Patients are selected via a lottery and there is often a waiting list. °  °[facility] [address], °[place] ° (336) 763-8833 [email or web address] °  °[facility] [address], [place], [place] (336)723-1848, Ext. 123 Second and Fourth Thursday of each month, opens at 6:30 AM; Clinic ends at 9 AM.  Patients are seen on a first-come first-served basis, and a limited number are seen during each clinic.  ° °[facility] ° [address], [place], [place] (336) 723-7904   Eligibility Requirements °You must have lived in [place], [place], or [place] counties for at least the last three months. °  You cannot be eligible for state or federal sponsored healthcare insurance, including Veterans Administration, Medicaid, or Medicare. °  You generally cannot be eligible for healthcare insurance through your employer.  °  How to apply: °Eligibility screenings are held every Tuesday and Wednesday afternoon  from 1:00 pm until 4:00 pm. You do not need an appointment for the interview!  °[facility] [address], [place], [place] 336-631-2330   °[facility]  336-342-8273   °[facility]  336-703-3100   °[facility]  336-570-6415   ° °Behavioral Health Resources in the Community: °Intensive Outpatient Programs °Organization         Address  Phone  Notes  °[facility] [address], [place], [place] 336-878-6098   °[facility] [address], [place], [place] 336-832-9800   °[facility] [address], [place], [place] °   336-882-2125   °[facility] [address],  °[place], [place] 1-800-853-5163 or 336-641-4981   °Substance Abuse Resources °Organization         Address  Phone  Notes  °[facility]  336-882-2125   °[facility]  336-784-9470   °[facility]  336-285-9073   °[facility]  336-845-3988   °Residential & Outpatient Substance Abuse Program  1-800-659-3381   °Psychological Services °Organization         Address  Phone  Notes  °[facility]  336- 832-9600   °[facility]  336- 378-7881   °[facility] [address], [place] 1-800-853-5163 or 336-641-4981   ° °Mobile Crisis Teams °Organization         Address  Phone  Notes  °[facility], Mobile Crisis Care Unit  1-877-626-1772   °Assertive °[facility] ° [address] [place], [place] 336-834-9664   °Sharon DeEsch [address] °[place] 336-554-5454   ° °Self-Help/Support Groups °Organization         Address  Phone             Notes  °[facility] - variety of support groups  336- 373-1402 Call for more information  °Narcotics Anonymous (NA), [facility] [address], °[place]  2 meetings at this location  ° °Residential Treatment  Programs °Organization         Address  Phone  Notes  °[facility] [address],    °[place]  1-866-801-8205   °[facility] ° [address], [place], [place] 704-293-8524   °[facility] [address], [place] 336-845-3988 Admissions: 8am-3pm M-F  °[facility] [address]    °[place], [place] 336-841-1104   °[facility] [address], [place], [place] 336-379-7146   °[facility] [address]  °[place], [place] 336-285-9073   °[facility] Intensive Outpatient [address], [place], [place] 336-852-3033   °[facility] [address]  °[place], [place] 1-877-615-2722 or 336-784-9470   °[facility] [address] [place], [place] 336-227-7417 Accepts Medicaid  °[facility] [address]  °[place] 1-800-659-3381 Substance Abuse/Addiction Treatment  ° °[place] Behavioral Health Resources °Organization         Address  Phone  Notes  °[facility]  (888) 581-9988   °Julie Brannon, PhD [address] [place], [place]   (336) 349-5553 or (336) 951-0000   ° Behavioral   [address] °[place], [place] (336) 349-4454   °[facility] [address], [place], [place] (336) 342-8316 Insurance/Medicaid/sponsorship through [facility]  °[facility] [address]                                    [place], [place] (336) 342-8316 Therapy/tele-psych/case  °[facility] [address]  ° [place], [place] (336) 349-2233    °Dr. Arfeen  (336) 349-4544   °[facility] [place]  [employer] [place] [facility] 1) [address], [place] °2) [address], [place] °3)  [address], [place] (336) 349-3220 °(336) 342-7768 ° °(336) 342-8140   °[place] [facility] (336) 342-1394 or (336) 342-3537 (  After Hours)

## 2014-06-18 NOTE — ED Notes (Signed)
Pt refused to answer when asked about his pain level.  Pt also instructed to find PCP to manage his chronic back pain.

## 2014-06-18 NOTE — ED Notes (Signed)
When pt was called to treatment he was sitting in chair in lobby with his right leg pulled up to his chest. When placed in room he requested to have Dilaudid for pain. States he can't get to the pain clinic in [place]

## 2014-06-18 NOTE — ED Notes (Signed)
Low back pain with radiation down both legs

## 2014-07-12 ENCOUNTER — Encounter (HOSPITAL_COMMUNITY): Payer: Self-pay | Admitting: Emergency Medicine

## 2014-07-12 ENCOUNTER — Emergency Department (HOSPITAL_COMMUNITY): Payer: Self-pay

## 2014-07-12 ENCOUNTER — Emergency Department (HOSPITAL_COMMUNITY)
Admission: EM | Admit: 2014-07-12 | Discharge: 2014-07-12 | Disposition: A | Discharge status: Home / Self Care | Payer: Self-pay | Attending: Emergency Medicine | Admitting: Emergency Medicine

## 2014-07-12 DIAGNOSIS — G8929 Other chronic pain: Secondary | ICD-10-CM | POA: Insufficient documentation

## 2014-07-12 DIAGNOSIS — M797 Fibromyalgia: Secondary | ICD-10-CM | POA: Insufficient documentation

## 2014-07-12 DIAGNOSIS — Z79899 Other long term (current) drug therapy: Secondary | ICD-10-CM | POA: Insufficient documentation

## 2014-07-12 DIAGNOSIS — Z720 Tobacco use: Secondary | ICD-10-CM | POA: Insufficient documentation

## 2014-07-12 DIAGNOSIS — R52 Pain, unspecified: Secondary | ICD-10-CM

## 2014-07-12 DIAGNOSIS — Z791 Long term (current) use of non-steroidal anti-inflammatories (NSAID): Secondary | ICD-10-CM | POA: Insufficient documentation

## 2014-07-12 DIAGNOSIS — Z792 Long term (current) use of antibiotics: Secondary | ICD-10-CM | POA: Insufficient documentation

## 2014-07-12 DIAGNOSIS — K6289 Other specified diseases of anus and rectum: Secondary | ICD-10-CM | POA: Insufficient documentation

## 2014-07-12 LAB — CBC WITH DIFFERENTIAL/PLATELET
BASOS ABS: 0 10*3/uL (ref 0.0–0.1)
Basophils Relative: 1 % (ref 0–1)
EOS PCT: 2 % (ref 0–5)
Eosinophils Absolute: 0.2 10*3/uL (ref 0.0–0.7)
HCT: 44.7 % (ref 39.0–52.0)
Hemoglobin: 15.3 g/dL (ref 13.0–17.0)
Lymphocytes Relative: 31 % (ref 12–46)
Lymphs Abs: 2.6 10*3/uL (ref 0.7–4.0)
MCH: 30.2 pg (ref 26.0–34.0)
MCHC: 34.2 g/dL (ref 30.0–36.0)
MCV: 88.2 fL (ref 78.0–100.0)
Monocytes Absolute: 0.4 10*3/uL (ref 0.1–1.0)
Monocytes Relative: 5 % (ref 3–12)
NEUTROS ABS: 5.1 10*3/uL (ref 1.7–7.7)
Neutrophils Relative %: 61 % (ref 43–77)
PLATELETS: 153 10*3/uL (ref 150–400)
RBC: 5.07 MIL/uL (ref 4.22–5.81)
RDW: 12.8 % (ref 11.5–15.5)
WBC: 8.3 10*3/uL (ref 4.0–10.5)

## 2014-07-12 LAB — COMPREHENSIVE METABOLIC PANEL
ALBUMIN: 4.4 g/dL (ref 3.5–5.2)
ALT: 34 U/L (ref 0–53)
ANION GAP: 6 (ref 5–15)
AST: 29 U/L (ref 0–37)
Alkaline Phosphatase: 64 U/L (ref 39–117)
BUN: 13 mg/dL (ref 6–23)
CHLORIDE: 104 mmol/L (ref 96–112)
CO2: 26 mmol/L (ref 19–32)
Calcium: 9.3 mg/dL (ref 8.4–10.5)
Creatinine, Ser: 1.19 mg/dL (ref 0.50–1.35)
GFR calc non Af Amer: 77 mL/min — ABNORMAL LOW (ref >90)
GFR, EST AFRICAN AMERICAN: 89 mL/min — AB (ref >90)
GLUCOSE: 102 mg/dL — AB (ref 70–99)
Potassium: 4.4 mmol/L (ref 3.5–5.1)
SODIUM: 136 mmol/L (ref 135–145)
TOTAL PROTEIN: 7.5 g/dL (ref 6.0–8.3)
Total Bilirubin: 0.5 mg/dL (ref 0.3–1.2)

## 2014-07-12 MED ORDER — HYDROMORPHONE HCL 4 MG PO TABS: 4.0000 mg | ORAL_TABLET | Freq: Four times a day (QID) | ORAL | Status: DC | PRN

## 2014-07-12 MED ORDER — HYDROMORPHONE HCL 1 MG/ML IJ SOLN
1.0000 mg | Freq: Once | INTRAMUSCULAR | Status: AC
  Administered 2014-07-12: 1 mg via INTRAVENOUS
  Filled 2014-07-12: qty 1

## 2014-07-12 MED ORDER — LORAZEPAM 2 MG/ML IJ SOLN
0.5000 mg | Freq: Once | INTRAMUSCULAR | Status: AC
  Administered 2014-07-12: 0.5 mg via INTRAVENOUS
  Filled 2014-07-12: qty 1

## 2014-07-12 MED ORDER — SODIUM CHLORIDE 0.9 % IV BOLUS (SEPSIS)
1000.0000 mL | Freq: Once | INTRAVENOUS | Status: AC
  Administered 2014-07-12: 1000 mL via INTRAVENOUS

## 2014-07-12 NOTE — Discharge Instructions (Signed)
Follow up with dr. Laural Golden in 1 week

## 2014-07-12 NOTE — ED Notes (Signed)
When pt assessment performed, pt states that he has been having rectal bleeding for over a decade now and that he is not really concerned about that because they have checked this out in the past and he just really needs something for his pain.  States chronic back pain and leg pain with no change in this other than he has no pain meds to manage it.  Unable to see a pain clinic and only Dilaudid or Methadone works for him.  Pt very anxious and rocking in the bed.

## 2014-07-12 NOTE — ED Notes (Signed)
Pt. States "I am seeking pain medications, but for the right reasons". Dr. Roderic Palau notified, advised to still give pt. Rx for pain medications.

## 2014-07-12 NOTE — ED Provider Notes (Signed)
CSN: [id number]     Arrival date & time 07/12/14  1752 History  This chart was scribed for Milton Ferguson, MD by Delphia Grates, ED Scribe. This patient was seen in room APA12/APA12 and the patient's care was started at 6:21 PM.   Chief Complaint  Patient presents with  . Blood In Stools    Patient is a 39 y.o. male presenting with hematochezia. The history is provided by the patient. No language interpreter was used.  Rectal Bleeding Quality:  Bright red Duration:  1 hour Timing:  Intermittent Progression:  Worsening Chronicity:  Recurrent Context: defecation   Similar prior episodes: yes   Relieved by:  None tried Worsened by:  Nothing tried Ineffective treatments:  None tried Associated symptoms: no abdominal pain      HPI Comments: Nathan Mckay is a 39 y.o. male who presents to the Emergency Department complaining of gradually worsening rectal bleeding since yesterday. Patient reports passing bright red blood with BMs and notes history of the same for the past 8 years. Patient has not seen a GI specialist for this due to lack of transportation. There is associated is associated moderate pain near the sacral region. No aggravating or alleviating factors noted. He denies any other symptoms.    Past Medical History  Diagnosis Date  . Chronic headaches   . Chronic back pain   . Polysubstance abuse   . Recurrent shoulder dislocation   . Sciatica   . Fibromyalgia   . Seizures    Past Surgical History  Procedure Laterality Date  . Hand surgery    . Wrist surgery     Family History  Problem Relation Age of Onset  . Cancer Mother   . Cancer Other   . Heart failure Other    History  Substance Use Topics  . Smoking status: Current Every Day Smoker -- 0.50 packs/day for 27 years    Types: Cigarettes  . Smokeless tobacco: Never Used  . Alcohol Use: No    Review of Systems  Constitutional: Negative for appetite change and fatigue.  HENT: Negative for congestion,  ear discharge and sinus pressure.   Eyes: Negative for discharge.  Respiratory: Negative for cough.   Cardiovascular: Negative for chest pain.  Gastrointestinal: Positive for hematochezia. Negative for abdominal pain and diarrhea.  Genitourinary: Negative for frequency and hematuria.  Musculoskeletal: Negative for back pain.  Skin: Negative for rash.  Neurological: Negative for seizures and headaches.  Psychiatric/Behavioral: Negative for hallucinations.      Allergies  Nickel  Home Medications   Prior to Admission medications   Medication Sig Start Date End Date Taking? Authorizing Provider  acetaminophen (TYLENOL) 500 MG tablet Take 1,000 mg by mouth every 6 (six) hours as needed for headache.    Historical Provider, MD  cephALEXin (KEFLEX) 500 MG capsule Take 1 capsule (500 mg total) by mouth 4 (four) times daily. 05/15/14   Milton Ferguson, MD  cyclobenzaprine (FLEXERIL) 10 MG tablet Take 1 tablet (10 mg total) by mouth 3 (three) times daily as needed. 06/18/14   Tammi Triplett, PA-C  diclofenac (VOLTAREN) 75 MG EC tablet Take 1 tablet (75 mg total) by mouth 2 (two) times daily. 03/23/14   Lily Kocher, PA-C  HYDROmorphone (DILAUDID) 4 MG tablet Take 1 tablet (4 mg total) by mouth every 6 (six) hours as needed for severe pain. 05/15/14   Milton Ferguson, MD  ibuprofen (ADVIL,MOTRIN) 600 MG tablet Take 1 tablet (600 mg total) by mouth every 6 (six)  hours as needed. 11/19/13   Evalee Jefferson, PA-C  orphenadrine (NORFLEX) 100 MG tablet Take 1 tablet (100 mg total) by mouth 2 (two) times daily. 03/23/14   Lily Kocher, PA-C  oxymetazoline (AFRIN) 0.05 % nasal spray Place 2 sprays into the nose 2 (two) times daily.    Historical Provider, MD  predniSONE (DELTASONE) 10 MG tablet Take 6 tablets day one, 5 tablets day two, 4 tablets day three, 3 tablets day four, 2 tablets day five, then 1 tablet day six 06/18/14   Tammi Triplett, PA-C  traMADol (ULTRAM) 50 MG tablet Take 1 tablet (50 mg total) by mouth  every 6 (six) hours as needed. 06/18/14   Tammi Triplett, PA-C   Triage Vitals: BP 142/104 mmHg  Pulse 120  Temp(Src) 97.9 F (36.6 C) (Oral)  Resp 18  Ht 5\' 9"  (1.753 m)  Wt 180 lb (81.647 kg)  BMI 26.57 kg/m2  SpO2 96%  Physical Exam  Constitutional: He is oriented to person, place, and time. He appears well-developed.  HENT:  Head: Normocephalic.  Eyes: Conjunctivae and EOM are normal. No scleral icterus.  Neck: Neck supple. No thyromegaly present.  Cardiovascular: Normal rate, regular rhythm and normal heart sounds.  Exam reveals no gallop and no friction rub.   No murmur heard. Pulmonary/Chest: Effort normal and breath sounds normal. No stridor. He has no wheezes. He has no rales. He exhibits no tenderness.  Abdominal: Soft. He exhibits no distension. There is no tenderness. There is no rebound.  Genitourinary:  Rectal exam.  Mild pain o/w nl.  Pos hemocolt  Musculoskeletal: Normal range of motion. He exhibits no edema.  Lymphadenopathy:    He has no cervical adenopathy.  Neurological: He is oriented to person, place, and time. He exhibits normal muscle tone. Coordination normal.  Skin: No rash noted. No erythema.  Psychiatric: He has a normal mood and affect. His behavior is normal.  Nursing note and vitals reviewed.   ED Course  Procedures (including critical care time)  DIAGNOSTIC STUDIES: Oxygen Saturation is 96% on room air, adequate by my interpretation.    COORDINATION OF CARE: At 1823 Discussed treatment plan with patient which includes review of records from previous visits to see if imaging will be needed. Patient agrees.   Labs Review Labs Reviewed - No data to display  Imaging Review No results found.   EKG Interpretation None      MDM   Final diagnoses:  None   Rectal pain and blood from rectum.  Pt to follow up with gi   The chart was scribed for me under my direct supervision.  I personally performed the history, physical, and medical  decision making and all procedures in the evaluation of this patient.Milton Ferguson, MD 07/12/14 2239

## 2014-07-12 NOTE — ED Notes (Signed)
Pt. Very agitated that he has not received any pain medication.

## 2014-07-12 NOTE — ED Notes (Signed)
Pt reports blood in his stools x 8 years. Reports bright red blood increased over the past few days.

## 2014-07-25 ENCOUNTER — Other Ambulatory Visit (INDEPENDENT_AMBULATORY_CARE_PROVIDER_SITE_OTHER): Payer: Self-pay | Admitting: *Deleted

## 2014-07-25 ENCOUNTER — Telehealth (INDEPENDENT_AMBULATORY_CARE_PROVIDER_SITE_OTHER): Payer: Self-pay | Admitting: *Deleted

## 2014-07-25 ENCOUNTER — Ambulatory Visit (INDEPENDENT_AMBULATORY_CARE_PROVIDER_SITE_OTHER): Payer: Self-pay | Admitting: Internal Medicine

## 2014-07-25 ENCOUNTER — Institutional Professional Consult (permissible substitution) (INDEPENDENT_AMBULATORY_CARE_PROVIDER_SITE_OTHER): Payer: Self-pay | Admitting: Internal Medicine

## 2014-07-25 ENCOUNTER — Encounter (INDEPENDENT_AMBULATORY_CARE_PROVIDER_SITE_OTHER): Payer: Self-pay | Admitting: Internal Medicine

## 2014-07-25 VITALS — BP 108/62 | HR 72 | Temp 97.9°F | Ht 69.0 in | Wt 190.1 lb

## 2014-07-25 DIAGNOSIS — Z1211 Encounter for screening for malignant neoplasm of colon: Secondary | ICD-10-CM

## 2014-07-25 DIAGNOSIS — K625 Hemorrhage of anus and rectum: Secondary | ICD-10-CM

## 2014-07-25 MED ORDER — PEG 3350-KCL-NA BICARB-NACL 420 G PO SOLR: 4000.0000 mL | Freq: Once | ORAL | Status: DC

## 2014-07-25 NOTE — Progress Notes (Signed)
Subjective:    Patient ID: Nathan Mckay, male    DOB: [birth year], 39 y.o.   MRN: [id number]  HPI Referred to our office by ED at [facility] for rectal bleeding. Stool was positive in the ED for blood. CBC in the ED was normal at 15.3. Seen 07/12/2014. He has had rectal bleeding for over a year. The bleeding usually with every BMs. Describes as BRRB and some marron colored.  He says he has rectal pain which he describes as chronic. He does not think he has hemorrhoids. Usually has a BM daily.  He describes the blood as marbled thru his stool. Stool are occasionally harder in texture. Appetite is good. No weight loss. No nausea or vomiting. No weight loss. He has actually gained weight.  Hx of substance abuse No family hx of colon cancer.    CBC    Component Value Date/Time   WBC 8.3 07/12/2014 1836   RBC 5.07 07/12/2014 1836   HGB 15.3 07/12/2014 1836   HCT 44.7 07/12/2014 1836   PLT 153 07/12/2014 1836   MCV 88.2 07/12/2014 1836   MCH 30.2 07/12/2014 1836   MCHC 34.2 07/12/2014 1836   RDW 12.8 07/12/2014 1836   LYMPHSABS 2.6 07/12/2014 1836   MONOABS 0.4 07/12/2014 1836   EOSABS 0.2 07/12/2014 1836   BASOSABS 0.0 07/12/2014 1836    07/12/2014 Acute Abdomen: rectal bleeding:  MPRESSION: Normal bowel gas pattern.    Review of Systems     Past Medical History  Diagnosis Date  . Chronic headaches   . Chronic back pain   . Polysubstance abuse   . Recurrent shoulder dislocation   . Sciatica   . Fibromyalgia   . Seizures     Past Surgical History  Procedure Laterality Date  . Hand surgery    . Wrist surgery      Allergies  Allergen Reactions  . Nickel Rash    Current Outpatient Prescriptions on File Prior to Visit  Medication Sig Dispense Refill  . acetaminophen (TYLENOL) 500 MG tablet Take 1,000 mg by mouth every 6 (six) hours as needed for headache.    . cephALEXin (KEFLEX) 500 MG capsule Take 1 capsule (500 mg total) by mouth 4 (four) times daily. (Patient  not taking: Reported on 07/12/2014) 28 capsule 0  . cyclobenzaprine (FLEXERIL) 10 MG tablet Take 1 tablet (10 mg total) by mouth 3 (three) times daily as needed. (Patient not taking: Reported on 07/12/2014) 21 tablet 0  . diclofenac (VOLTAREN) 75 MG EC tablet Take 1 tablet (75 mg total) by mouth 2 (two) times daily. (Patient not taking: Reported on 07/12/2014) 14 tablet 0  . HYDROmorphone (DILAUDID) 4 MG tablet Take 1 tablet (4 mg total) by mouth every 6 (six) hours as needed for severe pain. 20 tablet 0  . ibuprofen (ADVIL,MOTRIN) 600 MG tablet Take 1 tablet (600 mg total) by mouth every 6 (six) hours as needed. (Patient not taking: Reported on 07/12/2014) 30 tablet 0  . orphenadrine (NORFLEX) 100 MG tablet Take 1 tablet (100 mg total) by mouth 2 (two) times daily. (Patient not taking: Reported on 07/12/2014) 14 tablet 0  . predniSONE (DELTASONE) 10 MG tablet Take 6 tablets day one, 5 tablets day two, 4 tablets day three, 3 tablets day four, 2 tablets day five, then 1 tablet day six (Patient not taking: Reported on 07/12/2014) 21 tablet 0  . traMADol (ULTRAM) 50 MG tablet Take 1 tablet (50 mg total) by mouth every 6 (six)  hours as needed. (Patient not taking: Reported on 07/12/2014) 15 tablet 0   No current facility-administered medications on file prior to visit.     Objective:   Physical ExamBlood pressure 108/62, pulse 72, temperature 97.9 F (36.6 C), height 5\' 9"  (1.753 m), weight 190 lb 1.6 oz (86.229 kg).  Alert and oriented. Skin warm and dry. Oral mucosa is moist.   . Sclera anicteric, conjunctivae is pink. Thyroid not enlarged. No cervical lymphadenopathy. Lungs clear. Heart regular rate and rhythm.  Abdomen is soft. Bowel sounds are positive. No hepatomegaly. No abdominal masses felt. No tenderness.  No edema to lower extremities. Stool brown and guaiac negative.   Developer: [id number], Exp 9-17 Card: Lot 02-14, Exp 07.18     Assessment & Plan:  Rectal bleeding. Guaiac positive in the  ED. Colonic neoplasm needs to be ruled out. AVM, polyp, hemorrhoids in the differential. The risks and benefits such as perforation, bleeding, and infection were reviewed with the patient and is agreeable.

## 2014-07-25 NOTE — Telephone Encounter (Signed)
Patient needs trilyte 

## 2014-07-25 NOTE — Telephone Encounter (Signed)
Patient would like to know if you could give him something for his pain

## 2014-07-25 NOTE — Patient Instructions (Signed)
Colonoscopy.  The risks and benefits such as perforation, bleeding, and infection were reviewed with the patient and is agreeable. 

## 2014-07-25 NOTE — Telephone Encounter (Signed)
I spoke with patient. Our office does not give out pain medication. Patient is already taking Dilaudid

## 2014-08-06 ENCOUNTER — Encounter (HOSPITAL_COMMUNITY): Payer: Self-pay

## 2014-08-06 NOTE — Patient Instructions (Signed)
HIRO VIPOND  08/06/2014   Your procedure is scheduled on:  08/10/2014  Report to [facility] at 7:15 AM.  Call this number if you have problems the morning of surgery: [phone]   Remember:   Do not eat food or drink liquids after midnight.   Take these medicines the morning of surgery with A SIP OF WATER: Flexeril,Dilaudid   Do not wear jewelry, make-up or nail polish.  Do not wear lotions, powders, or perfumes. You may wear deodorant.  Do not shave 48 hours prior to surgery. Men may shave face and neck.  Do not bring valuables to the hospital.  [facility] is not responsible for any belongings or valuables.               Contacts, dentures or bridgework may not be worn into surgery.  Leave suitcase in the car. After surgery it may be brought to your room.  For patients admitted to the hospital, discharge time is determined by your treatment team.               Patients discharged the day of surgery will not be allowed to drive home.  Name and phone number of your driver:   Special Instructions: N/A   Please read over the following fact sheets that you were given: Anesthesia Post-op Instructions   PATIENT INSTRUCTIONS POST-ANESTHESIA  IMMEDIATELY FOLLOWING SURGERY:  Do not drive or operate machinery for the first twenty four hours after surgery.  Do not make any important decisions for twenty four hours after surgery or while taking narcotic pain medications or sedatives.  If you develop intractable nausea and vomiting or a severe headache please notify your doctor immediately.  FOLLOW-UP:  Please make an appointment with your surgeon as instructed. You do not need to follow up with anesthesia unless specifically instructed to do so.  WOUND CARE INSTRUCTIONS (if applicable):  Keep a dry clean dressing on the anesthesia/puncture wound site if there is drainage.  Once the wound has quit draining you may leave it open to air.  Generally you should leave the bandage intact for  twenty four hours unless there is drainage.  If the epidural site drains for more than 36-48 hours please call the anesthesia department.  QUESTIONS?:  Please feel free to call your physician or the hospital operator if you have any questions, and they will be happy to assist you.      Colonoscopy A colonoscopy is an exam to look at the entire large intestine (colon). This exam can help find problems such as tumors, polyps, inflammation, and areas of bleeding. The exam takes about 1 hour.  LET [facility] CARE PROVIDER KNOW ABOUT:   Any allergies you have.  All medicines you are taking, including vitamins, herbs, eye drops, creams, and over-the-counter medicines.  Previous problems you or members of your family have had with the use of anesthetics.  Any blood disorders you have.  Previous surgeries you have had.  Medical conditions you have. RISKS AND COMPLICATIONS  Generally, this is a safe procedure. However, as with any procedure, complications can occur. Possible complications include:  Bleeding.  Tearing or rupture of the colon wall.  Reaction to medicines given during the exam.  Infection (rare). BEFORE THE PROCEDURE   Ask your health care provider about changing or stopping your regular medicines.  You may be prescribed an oral bowel prep. This involves drinking a large amount of medicated liquid, starting the day before your procedure. The  liquid will cause you to have multiple loose stools until your stool is almost clear or light green. This cleans out your colon in preparation for the procedure.  Do not eat or drink anything else once you have started the bowel prep, unless your health care provider tells you it is safe to do so.  Arrange for someone to drive you home after the procedure. PROCEDURE   You will be given medicine to help you relax (sedative).  You will lie on your side with your knees bent.  A long, flexible tube with a light and camera on the end  (colonoscope) will be inserted through the rectum and into the colon. The camera sends video back to a computer screen as it moves through the colon. The colonoscope also releases carbon dioxide gas to inflate the colon. This helps your health care provider see the area better.  During the exam, your health care provider may take a small tissue sample (biopsy) to be examined under a microscope if any abnormalities are found.  The exam is finished when the entire colon has been viewed. AFTER THE PROCEDURE   Do not drive for 24 hours after the exam.  You may have a small amount of blood in your stool.  You may pass moderate amounts of gas and have mild abdominal cramping or bloating. This is caused by the gas used to inflate your colon during the exam.  Ask when your test results will be ready and how you will get your results. Make sure you get your test results. Document Released: 03/27/2000 Document Revised: 01/18/2013 Document Reviewed: 12/05/2012 [facility] Patient Information 2015 [place], [place]. This information is not intended to replace advice given to you by your health care provider. Make sure you discuss any questions you have with your health care provider.

## 2014-08-07 ENCOUNTER — Encounter (HOSPITAL_COMMUNITY): Payer: Self-pay

## 2014-08-07 ENCOUNTER — Other Ambulatory Visit: Payer: Self-pay

## 2014-08-07 ENCOUNTER — Encounter (HOSPITAL_COMMUNITY)
Admission: RE | Admit: 2014-08-07 | Discharge: 2014-08-07 | Disposition: A | Discharge status: Home / Self Care | Payer: Self-pay | Source: Ambulatory Visit | Attending: Internal Medicine | Admitting: Internal Medicine

## 2014-08-07 VITALS — BP 110/55 | HR 70 | Temp 98.0°F | Resp 16 | Ht 69.0 in | Wt 186.0 lb

## 2014-08-07 DIAGNOSIS — K921 Melena: Secondary | ICD-10-CM | POA: Insufficient documentation

## 2014-08-07 DIAGNOSIS — Z01810 Encounter for preprocedural cardiovascular examination: Secondary | ICD-10-CM | POA: Insufficient documentation

## 2014-08-07 DIAGNOSIS — K625 Hemorrhage of anus and rectum: Secondary | ICD-10-CM

## 2014-08-10 ENCOUNTER — Ambulatory Visit (HOSPITAL_COMMUNITY): Payer: Self-pay | Admitting: Anesthesiology

## 2014-08-10 ENCOUNTER — Ambulatory Visit: Admit: 2014-08-10 | Payer: Self-pay | Admitting: Internal Medicine

## 2014-08-10 ENCOUNTER — Encounter (HOSPITAL_COMMUNITY)
Admission: RE | Disposition: A | Discharge status: Home / Self Care | Payer: Self-pay | Source: Ambulatory Visit | Attending: Internal Medicine

## 2014-08-10 ENCOUNTER — Encounter (HOSPITAL_COMMUNITY): Payer: Self-pay | Admitting: *Deleted

## 2014-08-10 ENCOUNTER — Ambulatory Visit (HOSPITAL_COMMUNITY)
Admission: RE | Admit: 2014-08-10 | Discharge: 2014-08-10 | Disposition: A | Discharge status: Home / Self Care | Payer: Self-pay | Source: Ambulatory Visit | Attending: Internal Medicine | Admitting: Internal Medicine

## 2014-08-10 DIAGNOSIS — Z5309 Procedure and treatment not carried out because of other contraindication: Secondary | ICD-10-CM

## 2014-08-10 DIAGNOSIS — Q438 Other specified congenital malformations of intestine: Secondary | ICD-10-CM

## 2014-08-10 DIAGNOSIS — Z79899 Other long term (current) drug therapy: Secondary | ICD-10-CM | POA: Insufficient documentation

## 2014-08-10 DIAGNOSIS — D128 Benign neoplasm of rectum: Secondary | ICD-10-CM | POA: Insufficient documentation

## 2014-08-10 DIAGNOSIS — K625 Hemorrhage of anus and rectum: Secondary | ICD-10-CM

## 2014-08-10 DIAGNOSIS — K648 Other hemorrhoids: Secondary | ICD-10-CM

## 2014-08-10 DIAGNOSIS — K921 Melena: Secondary | ICD-10-CM | POA: Insufficient documentation

## 2014-08-10 DIAGNOSIS — G40909 Epilepsy, unspecified, not intractable, without status epilepticus: Secondary | ICD-10-CM | POA: Insufficient documentation

## 2014-08-10 DIAGNOSIS — K5900 Constipation, unspecified: Secondary | ICD-10-CM | POA: Insufficient documentation

## 2014-08-10 DIAGNOSIS — K644 Residual hemorrhoidal skin tags: Secondary | ICD-10-CM | POA: Insufficient documentation

## 2014-08-10 DIAGNOSIS — F17210 Nicotine dependence, cigarettes, uncomplicated: Secondary | ICD-10-CM | POA: Insufficient documentation

## 2014-08-10 DIAGNOSIS — M797 Fibromyalgia: Secondary | ICD-10-CM | POA: Insufficient documentation

## 2014-08-10 DIAGNOSIS — R51 Headache: Secondary | ICD-10-CM | POA: Insufficient documentation

## 2014-08-10 HISTORY — PX: COLONOSCOPY WITH PROPOFOL: SHX5780

## 2014-08-10 HISTORY — PX: POLYPECTOMY: SHX5525

## 2014-08-10 LAB — RAPID URINE DRUG SCREEN, HOSP PERFORMED
AMPHETAMINES: NOT DETECTED
Barbiturates: NOT DETECTED
Benzodiazepines: POSITIVE — AB
COCAINE: NOT DETECTED
Opiates: POSITIVE — AB
Tetrahydrocannabinol: POSITIVE — AB

## 2014-08-10 SURGERY — COLONOSCOPY
Anesthesia: Monitor Anesthesia Care

## 2014-08-10 SURGERY — COLONOSCOPY WITH PROPOFOL
Anesthesia: Monitor Anesthesia Care

## 2014-08-10 MED ORDER — MIDAZOLAM HCL 2 MG/2ML IJ SOLN
INTRAMUSCULAR | Status: AC
  Filled 2014-08-10: qty 2

## 2014-08-10 MED ORDER — GLYCOPYRROLATE 0.2 MG/ML IJ SOLN
0.2000 mg | Freq: Once | INTRAMUSCULAR | Status: AC
  Administered 2014-08-10: 0.2 mg via INTRAVENOUS

## 2014-08-10 MED ORDER — PROPOFOL INFUSION 10 MG/ML OPTIME
INTRAVENOUS | Status: DC | PRN
  Administered 2014-08-10: 135 ug/kg/min via INTRAVENOUS
  Administered 2014-08-10 (×2): via INTRAVENOUS

## 2014-08-10 MED ORDER — FENTANYL CITRATE (PF) 100 MCG/2ML IJ SOLN: 25.0000 ug | INTRAMUSCULAR | Status: DC | PRN

## 2014-08-10 MED ORDER — ONDANSETRON HCL 4 MG/2ML IJ SOLN
INTRAMUSCULAR | Status: AC
  Filled 2014-08-10: qty 2

## 2014-08-10 MED ORDER — ONDANSETRON HCL 4 MG/2ML IJ SOLN: 4.0000 mg | Freq: Once | INTRAMUSCULAR | Status: DC | PRN

## 2014-08-10 MED ORDER — STERILE WATER FOR IRRIGATION IR SOLN
Status: DC | PRN
  Administered 2014-08-10: 1000 mL

## 2014-08-10 MED ORDER — LIDOCAINE HCL (PF) 1 % IJ SOLN
INTRAMUSCULAR | Status: AC
  Filled 2014-08-10: qty 20

## 2014-08-10 MED ORDER — PROPOFOL 10 MG/ML IV BOLUS
INTRAVENOUS | Status: AC
  Filled 2014-08-10: qty 20

## 2014-08-10 MED ORDER — MIDAZOLAM HCL 5 MG/5ML IJ SOLN
INTRAMUSCULAR | Status: DC | PRN
  Administered 2014-08-10: 2 mg via INTRAVENOUS

## 2014-08-10 MED ORDER — MIDAZOLAM HCL 2 MG/2ML IJ SOLN
1.0000 mg | INTRAMUSCULAR | Status: DC | PRN
Start: 2014-08-10 — End: 2014-08-10
  Administered 2014-08-10: 2 mg via INTRAVENOUS

## 2014-08-10 MED ORDER — GLYCOPYRROLATE 0.2 MG/ML IJ SOLN
INTRAMUSCULAR | Status: AC
  Filled 2014-08-10: qty 1

## 2014-08-10 MED ORDER — FENTANYL CITRATE (PF) 100 MCG/2ML IJ SOLN
25.0000 ug | INTRAMUSCULAR | Status: AC
  Administered 2014-08-10 (×2): 25 ug via INTRAVENOUS

## 2014-08-10 MED ORDER — ROCURONIUM BROMIDE 50 MG/5ML IV SOLN
INTRAVENOUS | Status: AC
  Filled 2014-08-10: qty 2

## 2014-08-10 MED ORDER — FENTANYL CITRATE (PF) 100 MCG/2ML IJ SOLN
INTRAMUSCULAR | Status: AC
  Filled 2014-08-10: qty 2

## 2014-08-10 MED ORDER — LACTATED RINGERS IV SOLN
INTRAVENOUS | Status: DC
  Administered 2014-08-10: 09:00:00 via INTRAVENOUS

## 2014-08-10 MED ORDER — ONDANSETRON HCL 4 MG/2ML IJ SOLN
4.0000 mg | Freq: Once | INTRAMUSCULAR | Status: AC
  Administered 2014-08-10: 4 mg via INTRAVENOUS

## 2014-08-10 SURGICAL SUPPLY — 12 items
FLOOR PAD 36X40 (MISCELLANEOUS) ×4
FORCEPS BIOP RAD 4 LRG CAP 4 (CUTTING FORCEPS) ×2 IMPLANT
FORMALIN 10 PREFIL 20ML (MISCELLANEOUS) ×2 IMPLANT
KIT CLEAN ENDO COMPLIANCE (KITS) ×4 IMPLANT
LUBRICANT JELLY 4.5OZ STERILE (MISCELLANEOUS) ×2 IMPLANT
MANIFOLD NEPTUNE II (INSTRUMENTS) ×2 IMPLANT
PAD FLOOR 36X40 (MISCELLANEOUS) IMPLANT
SNARE ROTATE MED OVAL 20MM (MISCELLANEOUS) ×2 IMPLANT
SYR 50ML LL SCALE MARK (SYRINGE) ×2 IMPLANT
TRAP SPECIMEN MUCOUS 40CC (MISCELLANEOUS) ×2 IMPLANT
TUBING IRRIGATION ENDOGATOR (MISCELLANEOUS) ×2 IMPLANT
WATER STERILE IRR 1000ML POUR (IV SOLUTION) ×2 IMPLANT

## 2014-08-10 NOTE — Anesthesia Preprocedure Evaluation (Addendum)
Anesthesia Evaluation  Patient identified by MRN, date of birth, ID band Patient awake    Reviewed: Allergy & Precautions, NPO status , Patient's Chart, lab work & pertinent test results  Airway Mallampati: II  TM Distance: >3 FB     Dental  (+) Teeth Intact, Poor Dentition, Dental Advisory Given   Pulmonary Current Smoker,  breath sounds clear to auscultation        Cardiovascular negative cardio ROS  Rhythm:Regular Rate:Normal     Neuro/Psych  Headaches, Seizures -, Well Controlled,   Neuromuscular disease    GI/Hepatic (+)     substance abuse  cocaine use and marijuana use, Positive THC   Endo/Other    Renal/GU      Musculoskeletal  (+) Fibromyalgia -  Abdominal   Peds  Hematology   Anesthesia Other Findings Hx dilaudid abuse.  Reproductive/Obstetrics                           Anesthesia Physical Anesthesia Plan  ASA: III  Anesthesia Plan: MAC   Post-op Pain Management:    Induction: Intravenous  Airway Management Planned: Simple Face Mask  Additional Equipment:   Intra-op Plan:   Post-operative Plan:   Informed Consent: I have reviewed the patients History and Physical, chart, labs and discussed the procedure including the risks, benefits and alternatives for the proposed anesthesia with the patient or authorized representative who has indicated his/her understanding and acceptance.     Plan Discussed with:   Anesthesia Plan Comments:         Anesthesia Quick Evaluation

## 2014-08-10 NOTE — Discharge Instructions (Addendum)
Resume usual medications. High fiber diet. Benefiber 4 g by mouth daily at bedtime. Colace 200 mg by mouth daily at bedtime. No driving for 24 hours. Physician will call with biopsy results.  Colon Polyps Polyps are lumps of extra tissue growing inside the body. Polyps can grow in the large intestine (colon). Most colon polyps are noncancerous (benign). However, some colon polyps can become cancerous over time. Polyps that are larger than a pea may be harmful. To be safe, caregivers remove and test all polyps. CAUSES  Polyps form when mutations in the genes cause your cells to grow and divide even though no more tissue is needed. RISK FACTORS There are a number of risk factors that can increase your chances of getting colon polyps. They include:  Being older than 50 years.  Family history of colon polyps or colon cancer.  Long-term colon diseases, such as colitis or Crohn disease.  Being overweight.  Smoking.  Being inactive.  Drinking too much alcohol. SYMPTOMS  Most small polyps do not cause symptoms. If symptoms are present, they may include:  Blood in the stool. The stool may look dark red or black.  Constipation or diarrhea that lasts longer than 1 week. DIAGNOSIS People often do not know they have polyps until their caregiver finds them during a regular checkup. Your caregiver can use 4 tests to check for polyps:  Digital rectal exam. The caregiver wears gloves and feels inside the rectum. This test would find polyps only in the rectum.  Barium enema. The caregiver puts a liquid called barium into your rectum before taking X-rays of your colon. Barium makes your colon look white. Polyps are dark, so they are easy to see in the X-ray pictures.  Sigmoidoscopy. A thin, flexible tube (sigmoidoscope) is placed into your rectum. The sigmoidoscope has a light and tiny camera in it. The caregiver uses the sigmoidoscope to look at the last third of your colon.  Colonoscopy.  This test is like sigmoidoscopy, but the caregiver looks at the entire colon. This is the most common method for finding and removing polyps. TREATMENT  Any polyps will be removed during a sigmoidoscopy or colonoscopy. The polyps are then tested for cancer. PREVENTION  To help lower your risk of getting more colon polyps:  Eat plenty of fruits and vegetables. Avoid eating fatty foods.  Do not smoke.  Avoid drinking alcohol.  Exercise every day.  Lose weight if recommended by your caregiver.  Eat plenty of calcium and folate. Foods that are rich in calcium include milk, cheese, and broccoli. Foods that are rich in folate include chickpeas, kidney beans, and spinach. HOME CARE INSTRUCTIONS Keep all follow-up appointments as directed by your caregiver. You may need periodic exams to check for polyps. SEEK MEDICAL CARE IF: You notice bleeding during a bowel movement. Document Released: 12/25/2003 Document Revised: 06/22/2011 Document Reviewed: 06/09/2011 [facility] Patient Information 2015 [place], [place]. This information is not intended to replace advice given to you by your health care provider. Make sure you discuss any questions you have with your health care provider. High-Fiber Diet Fiber is found in fruits, vegetables, and grains. A high-fiber diet encourages the addition of more whole grains, legumes, fruits, and vegetables in your diet. The recommended amount of fiber for adult males is 38 g per day. For adult females, it is 25 g per day. Pregnant and lactating women should get 28 g of fiber per day. If you have a digestive or bowel problem, ask your caregiver for  advice before adding high-fiber foods to your diet. Eat a variety of high-fiber foods instead of only a select few type of foods.  PURPOSE  To increase stool bulk.  To make bowel movements more regular to prevent constipation.  To lower cholesterol.  To prevent overeating. WHEN IS THIS DIET USED?  It may be used if  you have constipation and hemorrhoids.  It may be used if you have uncomplicated diverticulosis (intestine condition) and irritable bowel syndrome.  It may be used if you need help with weight management.  It may be used if you want to add it to your diet as a protective measure against atherosclerosis, diabetes, and cancer. SOURCES OF FIBER  Whole-grain breads and cereals.  Fruits, such as apples, oranges, bananas, berries, prunes, and pears.  Vegetables, such as green peas, carrots, sweet potatoes, beets, broccoli, cabbage, spinach, and artichokes.  Legumes, such split peas, soy, lentils.  Almonds. FIBER CONTENT IN FOODS Starches and Grains / Dietary Fiber (g)  Cheerios, 1 cup / 3 g  Corn Flakes cereal, 1 cup / 0.7 g  Rice crispy treat cereal, 1 cup / 0.3 g  Instant oatmeal (cooked),  cup / 2 g  Frosted wheat cereal, 1 cup / 5.1 g  Brown, long-grain rice (cooked), 1 cup / 3.5 g  White, long-grain rice (cooked), 1 cup / 0.6 g  Enriched macaroni (cooked), 1 cup / 2.5 g Legumes / Dietary Fiber (g)  Baked beans (canned, plain, or vegetarian),  cup / 5.2 g  Kidney beans (canned),  cup / 6.8 g  Pinto beans (cooked),  cup / 5.5 g Breads and Crackers / Dietary Fiber (g)  Plain or honey graham crackers, 2 squares / 0.7 g  Saltine crackers, 3 squares / 0.3 g  Plain, salted pretzels, 10 pieces / 1.8 g  Whole-wheat bread, 1 slice / 1.9 g  White bread, 1 slice / 0.7 g  Raisin bread, 1 slice / 1.2 g  Plain bagel, 3 oz / 2 g  Flour tortilla, 1 oz / 0.9 g  Corn tortilla, 1 small / 1.5 g  Hamburger or hotdog bun, 1 small / 0.9 g Fruits / Dietary Fiber (g)  Apple with skin, 1 medium / 4.4 g  Sweetened applesauce,  cup / 1.5 g  Banana,  medium / 1.5 g  Grapes, 10 grapes / 0.4 g  Orange, 1 small / 2.3 g  Raisin, 1.5 oz / 1.6 g  Melon, 1 cup / 1.4 g Vegetables / Dietary Fiber (g)  Green beans (canned),  cup / 1.3 g  Carrots (cooked),  cup / 2.3  g  Broccoli (cooked),  cup / 2.8 g  Peas (cooked),  cup / 4.4 g  Mashed potatoes,  cup / 1.6 g  Lettuce, 1 cup / 0.5 g  Corn (canned),  cup / 1.6 g  Tomato,  cup / 1.1 g Document Released: 03/30/2005 Document Revised: 09/29/2011 Document Reviewed: 07/02/2011 [employer] Patient Information 2015 [place], [place]. This information is not intended to replace advice given to you by your health care provider. Make sure you discuss any questions you have with your health care provider. Colonoscopy, Care After These instructions give you information on caring for yourself after your procedure. Your doctor may also give you more specific instructions. Call your doctor if you have any problems or questions after your procedure. HOME CARE  Do not drive for 24 hours.  Do not sign important papers or use machinery for 24 hours.  You may shower.  You may go back to your usual activities, but go slower for the first 24 hours.  Take rest breaks often during the first 24 hours.  Walk around or use warm packs on your belly (abdomen) if you have belly cramping or gas.  Drink enough fluids to keep your pee (urine) clear or pale yellow.  Resume your normal diet. Avoid heavy or fried foods.  Avoid drinking alcohol for 24 hours or as told by your doctor.  Only take medicines as told by your doctor. If a tissue sample (biopsy) was taken during the procedure:   Do not take aspirin or blood thinners for 7 days, or as told by your doctor.  Do not drink alcohol for 7 days, or as told by your doctor.  Eat soft foods for the first 24 hours. GET HELP IF: You still have a small amount of blood in your poop (stool) 2-3 days after the procedure. GET HELP RIGHT AWAY IF:  You have more than a small amount of blood in your poop.  You see clumps of tissue (blood clots) in your poop.  Your belly is puffy (swollen).  You feel sick to your stomach (nauseous) or throw up (vomit).  You have a  fever.  You have belly pain that gets worse and medicine does not help. MAKE SURE YOU:  Understand these instructions.  Will watch your condition.  Will get help right away if you are not doing well or get worse. Document Released: 05/02/2010 Document Revised: 04/04/2013 Document Reviewed: 12/05/2012 [facility] Patient Information 2015 [place], [place]. This information is not intended to replace advice given to you by your health care provider. Make sure you discuss any questions you have with your health care provider.

## 2014-08-10 NOTE — Addendum Note (Signed)
Addendum  created 08/10/14 1050 by Charmaine Downs, CRNA   Modules edited: Anesthesia Events

## 2014-08-10 NOTE — Transfer of Care (Signed)
Immediate Anesthesia Transfer of Care Note  Patient: Nathan Mckay  Procedure(s) Performed: Procedure(s): COLONOSCOPY WITH PROPOFOL (Incomplete exam-to Desecending Colon, Cecum seen from a distance) (N/A) POLYPECTOMY  Patient Location: PACU  Anesthesia Type:MAC  Level of Consciousness: awake, alert  and patient cooperative  Airway & Oxygen Therapy: Patient Spontanous Breathing and Patient connected to face mask oxygen  Post-op Assessment: Report given to RN, Post -op Vital signs reviewed and stable and Patient moving all extremities  Post vital signs: Reviewed and stable  Last Vitals:  Filed Vitals:   08/10/14 0900  BP: 107/75  Pulse:   Temp:   Resp: 25    Complications: No apparent anesthesia complications

## 2014-08-10 NOTE — Op Note (Signed)
COLONOSCOPY PROCEDURE REPORT  PATIENT:  Nathan Mckay  MR#:  [id number] Birthdate:  May 14, 1975, 39 y.o., male Endoscopist:  Dr. Rogene Houston, MD Referred By:  Dr. Milton Ferguson, MD  Procedure Date: 08/10/2014  Procedure:   Colonoscopy  Indications: Patient is 39 year old Caucasian male who presents with 10 year history of hematochezia. He says lately bleeding episodes been occurring more frequently. He does give history of constipation. He was seen in emergency room on 07/12/2014 and his H&H was 15.3 and 44.7. Family history is negative for CRC.  Informed Consent:  The procedure and risks were reviewed with the patient and informed consent was obtained.  Medications:  Monitored anesthesia care. Please see anesthesia records for details  Description of procedure:  After a digital rectal exam was performed, that colonoscope was advanced from the anus through the rectum and colon to the area of ascending colon and ileocecal valve was seen from a distance. Cecum could not be reached.. As the scope was gradually withdrawn mucosal surfaces were carefully surveyed utilizing scope tip to flexion to facilitate fold flattening as needed. The scope was pulled down into the rectum where a thorough exam including retroflexion was performed.  Findings:   Prep marginal. Examination incomplete to ascending colon. The cecal valve seen from a distance but cecum not reached. Redundant sigmoid colon. Small rectal polyp cold snared. Small hemorrhoids below the dentate line.   Therapeutic/Diagnostic Maneuvers Performed:  See above  Complications: None  Cecal Withdrawal Time:  NA as cecum not reached  Impression:  Incomplete exam to ascending colon. Ileocecal valve seen from a distance. Redundant colon with marginal prep. Small rectal polyp cold snared. Small external hemorrhoids.  Recommendations:  Standard instructions given. I will contact patient with biopsy results and further  recommendations.  Chasty Randal U  08/10/2014 9:58 AM  CC: Dr. Rayne Du PCP Per Patient & Dr. No ref. provider found

## 2014-08-10 NOTE — Anesthesia Postprocedure Evaluation (Signed)
  Anesthesia Post-op Note  Patient: Nathan Mckay  Procedure(s) Performed: Procedure(s): COLONOSCOPY WITH PROPOFOL (Incomplete exam-to Desecending Colon, Cecum seen from a distance) (N/A) POLYPECTOMY  Patient Location: PACU  Anesthesia Type:MAC  Level of Consciousness: awake, alert , oriented and patient cooperative  Airway and Oxygen Therapy: Patient Spontanous Breathing  Post-op Pain: mild  Post-op Assessment: Post-op Vital signs reviewed, Patient's Cardiovascular Status Stable, Respiratory Function Stable, Patent Airway and No signs of Nausea or vomiting  Post-op Vital Signs: Reviewed and stable  Last Vitals:  Filed Vitals:   08/10/14 1008  BP: 104/77  Pulse:   Temp:   Resp: 20    Complications: No apparent anesthesia complications

## 2014-08-10 NOTE — H&P (Signed)
Nathan Mckay is an 39 y.o. male.   Chief Complaint: Patient is here for colonoscopy. HPI: Patient is 39 year old Caucasian male who presents with 10 year history of rectal bleeding. Patient states most of the episodes occur with bowel movements but at times he will notice blood when he urinates.. She also complains of excruciating rectal pain. Patient states he does not have problem with drug abuse but he has chronic pain and nobody understands him. Patient was seen in emergency room on 3 3120s 16 and referred to her office. Patient was seen in her office on 07/25/2014 and noted to have heme-positive stool. Family's reason negative for CRC. Patient's H&H on 07/12/2014 was 15.3 and 44.7 and platelet count was 153K. Urine tox screen from today is negative for cocaine but positive for benzodiazepine opioids and tetrahydrocannabinol.     Past Medical History  Diagnosis Date  . Chronic headaches   . Chronic back pain   . Polysubstance abuse   . Recurrent shoulder dislocation   . Sciatica   . Fibromyalgia   . Seizures     states epilepsy was dx but they stopped his meds for seizures and he stopped his meds and the seizures stopped. last seizure was a year ago.    Past Surgical History  Procedure Laterality Date  . Hand surgery Right     broken hand  . Wrist surgery Right     fractured hand and wrist.    Family History  Problem Relation Age of Onset  . Cancer Mother   . Cancer Other   . Heart failure Other    Social History:  reports that he has been smoking Cigarettes.  He has a 27 pack-year smoking history. He has never used smokeless tobacco. He reports that he uses illicit drugs (Cocaine). He reports that he does not drink alcohol.  Allergies:  Allergies  Allergen Reactions  . Nickel Rash    Medications Prior to Admission  Medication Sig Dispense Refill  . acetaminophen (TYLENOL) 500 MG tablet Take 1,000 mg by mouth every 6 (six) hours as needed for headache.    Marland Kitchen  HYDROmorphone (DILAUDID) 4 MG tablet Take 1 tablet (4 mg total) by mouth every 6 (six) hours as needed for severe pain. 20 tablet 0  . cyclobenzaprine (FLEXERIL) 10 MG tablet Take 1 tablet (10 mg total) by mouth 3 (three) times daily as needed. (Patient not taking: Reported on 08/02/2014) 21 tablet 0  . polyethylene glycol-electrolytes (NULYTELY/GOLYTELY) 420 G solution Take 4,000 mLs by mouth once. 4000 mL 0    Results for orders placed or performed during the hospital encounter of 08/10/14 (from the past 48 hour(s))  Urine rapid drug screen (hosp performed)     Status: Abnormal   Collection Time: 08/10/14  8:00 AM  Result Value Ref Range   Opiates POSITIVE (A) NONE DETECTED   Cocaine NONE DETECTED NONE DETECTED   Benzodiazepines POSITIVE (A) NONE DETECTED   Amphetamines NONE DETECTED NONE DETECTED   Tetrahydrocannabinol POSITIVE (A) NONE DETECTED   Barbiturates NONE DETECTED NONE DETECTED    Comment:        DRUG SCREEN FOR MEDICAL PURPOSES ONLY.  IF CONFIRMATION IS NEEDED FOR ANY PURPOSE, NOTIFY LAB WITHIN 5 DAYS.        LOWEST DETECTABLE LIMITS FOR URINE DRUG SCREEN Drug Class       Cutoff (ng/mL) Amphetamine      1000 Barbiturate      200 Benzodiazepine   595 Tricyclics  300 Opiates          300 Cocaine          300 THC              50    No results found.  ROS  There were no vitals taken for this visit. Physical Exam  Constitutional: He appears well-developed and well-nourished.  HENT:  Mouth/Throat: Oropharynx is clear and moist.  Eyes: Conjunctivae are normal. No scleral icterus.  Neck: No thyromegaly present.  Cardiovascular: Normal rate, regular rhythm and normal heart sounds.   No murmur heard. Respiratory: Effort normal and breath sounds normal.  GI: Soft. He exhibits no distension and no mass. There is no tenderness.  Musculoskeletal: He exhibits no edema.  Lymphadenopathy:    He has no cervical adenopathy.  Neurological: He is alert.  Skin: Skin is  warm and dry.     Assessment/Plan Chronic hematochezia and rectal pain. Diagnostic colonoscopy under monitored anesthesia care.  Sylwia Cuervo U 08/10/2014, 8:50 AM

## 2014-08-13 ENCOUNTER — Encounter (HOSPITAL_COMMUNITY): Payer: Self-pay | Admitting: Internal Medicine

## 2014-08-14 ENCOUNTER — Emergency Department (HOSPITAL_COMMUNITY)
Admission: EM | Admit: 2014-08-14 | Discharge: 2014-08-14 | Disposition: A | Discharge status: Home / Self Care | Payer: Self-pay | Attending: Emergency Medicine | Admitting: Emergency Medicine

## 2014-08-14 ENCOUNTER — Encounter (HOSPITAL_COMMUNITY): Payer: Self-pay

## 2014-08-14 DIAGNOSIS — M549 Dorsalgia, unspecified: Secondary | ICD-10-CM

## 2014-08-14 DIAGNOSIS — Z87828 Personal history of other (healed) physical injury and trauma: Secondary | ICD-10-CM | POA: Insufficient documentation

## 2014-08-14 DIAGNOSIS — Z8739 Personal history of other diseases of the musculoskeletal system and connective tissue: Secondary | ICD-10-CM | POA: Insufficient documentation

## 2014-08-14 DIAGNOSIS — G8929 Other chronic pain: Secondary | ICD-10-CM | POA: Insufficient documentation

## 2014-08-14 DIAGNOSIS — Z720 Tobacco use: Secondary | ICD-10-CM | POA: Insufficient documentation

## 2014-08-14 DIAGNOSIS — Z8669 Personal history of other diseases of the nervous system and sense organs: Secondary | ICD-10-CM | POA: Insufficient documentation

## 2014-08-14 DIAGNOSIS — M545 Low back pain: Secondary | ICD-10-CM | POA: Insufficient documentation

## 2014-08-14 MED ORDER — HYDROMORPHONE HCL 2 MG PO TABS
2.0000 mg | ORAL_TABLET | Freq: Once | ORAL | Status: AC
  Administered 2014-08-14: 2 mg via ORAL
  Filled 2014-08-14: qty 1

## 2014-08-14 NOTE — Discharge Instructions (Signed)
You will need to follow up with a pain management clinic or a primary care doctor for your chronic pain management.

## 2014-08-14 NOTE — ED Notes (Signed)
Pt reports pain in lower back radiating down both legs for the past 10 years.  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  Reports has had blood in stool and came here approx 1 month ago for same.  Pt says was given a prescription for dilaudid and a referral to GI.  Pt says had colonoscopy Fri and had polyp removed.  Reports is still having blood in stool and is out of dilaudid.

## 2014-08-14 NOTE — ED Notes (Signed)
Social work still at bedside.

## 2014-08-14 NOTE — ED Notes (Signed)
Social Engineer, civil (consulting) with patient.

## 2014-08-14 NOTE — ED Notes (Signed)
Pt adamant about talking with EDP or NP about getting a prescription until he can get an appt. Scheduled with a PCP. EDP and NP notified.

## 2014-08-14 NOTE — ED Notes (Signed)
Patient was not receptive to CM information at the onset -- CM made another visit -- open to a call to set up a appt for E/E with [facility] to establish a medical home.

## 2014-08-14 NOTE — ED Notes (Signed)
Pt. Updated that he would not be receiving a prescription, that he is to follow up with CM when they call as pt. Did not want to set up appt at this time.

## 2014-08-14 NOTE — ED Provider Notes (Signed)
CSN: [id number]     Arrival date & time 08/14/14  1027 History   First MD Initiated Contact with Patient 08/14/14 1017     Chief Complaint  Patient presents with  . Medication Refill  . Back Pain     (Consider location/radiation/quality/duration/timing/severity/associated sxs/prior Treatment) Patient is a 39 y.o. male presenting with back pain. The history is provided by the patient.  Back Pain Location:  Lumbar spine Quality:  Shooting Radiates to:  L posterior upper leg and R posterior upper leg Pain severity:  Severe Pain is:  Same all the time Duration: 10 years. Chronicity:  Chronic  MARKCUS LAZENBY is a 39 y.o. male who presents to the ED with low back pain that radiates down both legs. He reports having the pain x 10 years. He is a patient in a methadone program for his pain. He was evaluated here in the ED about a month ago for blood in his stool. At that time he was given Rx for dilaudid and referral to GI. The patient did follow up and had a colonoscopy this past week. He had a polyp removed. He continues to have blood in his stool. He is here because he is out of his dilaudid. Patient states he does not have transportation to the pain clinic so he came here for his chronic pain management . He states he has a lot of responsibility and needs dilaudid so he can continue his daily routine.    Past Medical History  Diagnosis Date  . Chronic headaches   . Chronic back pain   . Polysubstance abuse   . Recurrent shoulder dislocation   . Sciatica   . Fibromyalgia   . Seizures     states epilepsy was dx but they stopped his meds for seizures and he stopped his meds and the seizures stopped. last seizure was a year ago.   Past Surgical History  Procedure Laterality Date  . Hand surgery Right     broken hand  . Wrist surgery Right     fractured hand and wrist.  . Colonoscopy with propofol N/A 08/10/2014    Procedure: COLONOSCOPY WITH PROPOFOL (Incomplete exam-to Desecending  Colon, Cecum seen from a distance);  Surgeon: Rogene Houston, MD;  Location: [facility];  Service: Endoscopy;  Laterality: N/A;  . Polypectomy  08/10/2014    Procedure: POLYPECTOMY;  Surgeon: Rogene Houston, MD;  Location: [facility];  Service: Endoscopy;;   Family History  Problem Relation Age of Onset  . Cancer Mother   . Cancer Other   . Heart failure Other    History  Substance Use Topics  . Smoking status: Current Every Day Smoker -- 1.00 packs/day for 27 years    Types: Cigarettes  . Smokeless tobacco: Never Used  . Alcohol Use: No    Review of Systems  Gastrointestinal: Positive for blood in stool.  Musculoskeletal: Positive for back pain.  all other systems negative    Allergies  Nickel  Home Medications   Prior to Admission medications   Medication Sig Start Date End Date Taking? Authorizing Provider  acetaminophen (TYLENOL) 500 MG tablet Take 1,000 mg by mouth every 6 (six) hours as needed for headache.   Yes Historical Provider, MD  HYDROmorphone (DILAUDID) 4 MG tablet Take 1 tablet (4 mg total) by mouth every 6 (six) hours as needed for severe pain. Patient not taking: Reported on 08/14/2014 07/12/14   Milton Ferguson, MD   BP 125/80 mmHg  Pulse 90  Temp(Src) 98 F (36.7 C) (Oral)  Resp 18  Ht 5\' 9"  (1.753 m)  Wt 180 lb (81.647 kg)  BMI 26.57 kg/m2  SpO2 100% Physical Exam  Constitutional: He is oriented to person, place, and time. He appears well-developed and well-nourished. No distress.  HENT:  Head: Normocephalic and atraumatic.  Eyes: EOM are normal. Pupils are equal, round, and reactive to light.  Neck: Normal range of motion. Neck supple.  Cardiovascular: Normal rate and regular rhythm.   Pulmonary/Chest: Effort normal. No respiratory distress. He has no wheezes. He has no rales.  Abdominal: Soft. Bowel sounds are normal. There is no tenderness.  Musculoskeletal: Normal range of motion. He exhibits no edema.       Lumbar back: He exhibits tenderness. He  exhibits normal range of motion, no deformity, no spasm and normal pulse.  Patient moves from lying to standing position quickly without difficulty. Ambulatory with fast pace without problems.   Neurological: He is alert and oriented to person, place, and time. He has normal strength. No cranial nerve deficit or sensory deficit. Coordination and gait normal.  Reflex Scores:      Bicep reflexes are 2+ on the right side and 2+ on the left side.      Brachioradialis reflexes are 2+ on the right side and 2+ on the left side.      Patellar reflexes are 2+ on the right side and 2+ on the left side.      Achilles reflexes are 2+ on the right side and 2+ on the left side. Skin: Skin is warm and dry.  Psychiatric: He has a normal mood and affect. His behavior is normal.  Nursing note and vitals reviewed.   ED Course  Procedures (including critical care time)  I explained to the patient that we do not treat chronic pain in the ED and he will need to see his doctor at the pain clinic. He states that he does not have a way and wants to see the doctor in charge so he can get his dilaudid.   Dr. Lacinda Axon in to evaluate the patient. Discussed chronic pain management and need for PCP or to return to the pain management clinic.  Social worker consult who sent extended amount of time with the patient trying to get him established with someone for primary care.   MDM  39 y.o. male with chronic low back pain here for chronic pain management. Stable for d/c with normal neuro exam. Hx of rectal bleeding being followed by GI. Stable for d/c without focal neuro deficits.   Final diagnoses:  Chronic back pain greater than 3 months duration       [facility], NP 08/14/14 1554  Nat Christen, MD 08/15/14 1255

## 2014-08-14 NOTE — ED Notes (Signed)
MD at bedside. 

## 2014-08-14 NOTE — Clinical Social Work Note (Signed)
CSW received consult for medication assistance. CM aware and will follow up. CSW signing off, but can be reconsulted if needed.  Benay Pike, [place]

## 2014-09-12 ENCOUNTER — Emergency Department (HOSPITAL_COMMUNITY)
Admission: EM | Admit: 2014-09-12 | Discharge: 2014-09-13 | Disposition: A | Discharge status: Home / Self Care | Payer: Self-pay | Attending: Emergency Medicine | Admitting: Emergency Medicine

## 2014-09-12 ENCOUNTER — Encounter (HOSPITAL_COMMUNITY): Payer: Self-pay | Admitting: *Deleted

## 2014-09-12 DIAGNOSIS — M545 Low back pain: Secondary | ICD-10-CM | POA: Insufficient documentation

## 2014-09-12 DIAGNOSIS — R3919 Other difficulties with micturition: Secondary | ICD-10-CM | POA: Insufficient documentation

## 2014-09-12 DIAGNOSIS — Z720 Tobacco use: Secondary | ICD-10-CM | POA: Insufficient documentation

## 2014-09-12 DIAGNOSIS — G8929 Other chronic pain: Secondary | ICD-10-CM

## 2014-09-12 DIAGNOSIS — M549 Dorsalgia, unspecified: Secondary | ICD-10-CM

## 2014-09-12 DIAGNOSIS — F419 Anxiety disorder, unspecified: Secondary | ICD-10-CM | POA: Insufficient documentation

## 2014-09-12 DIAGNOSIS — M2550 Pain in unspecified joint: Secondary | ICD-10-CM | POA: Insufficient documentation

## 2014-09-12 NOTE — ED Provider Notes (Signed)
CSN: [id number]     Arrival date & time 09/12/14  2216 History  This chart was scribed for Rolland Porter, MD by Julien Nordmann, ED Scribe. This patient was seen in room [facility] and the patient's care was started at 12:01 AM.    Chief Complaint  Patient presents with  . Back Pain     The history is provided by the patient. No language interpreter was used.    HPI Comments: Nathan Mckay is a 39 y.o. male who presents to the Emergency Department complaining of constant back pain for years, gradual worsening onset 2 days ago. He states the only thing that is different is "the date". Pt notes the pain feels like pins and needles in his legs, but states they are not numb. This is also old. He notes the pain radiates from his lower back to both of his legs and feet. He notes no alleviating factors. He states he cannot sit still because the pain will not alleviate. Pt notes sometimes  burning while urination, and he has trouble starting to urinate and his stream is smaller. He states this has been present since last year and he was checked at the health department and was told he didn't have an STD. Pt denies bowel incontinence. Patient denies having a doctor to manage his back problems. He states he's had x-rays and MRIs done here for his back.   Pt is a daily smoker and he smokes .5 packs a day.   Pt notes walking to the ED.   PCP none  Past Medical History  Diagnosis Date  . Chronic headaches   . Chronic back pain   . Polysubstance abuse   . Recurrent shoulder dislocation   . Sciatica   . Fibromyalgia   . Seizures     states epilepsy was dx but they stopped his meds for seizures and he stopped his meds and the seizures stopped. last seizure was a year ago.   Past Surgical History  Procedure Laterality Date  . Hand surgery Right     broken hand  . Wrist surgery Right     fractured hand and wrist.  . Colonoscopy with propofol N/A 08/10/2014    Procedure: COLONOSCOPY WITH PROPOFOL  (Incomplete exam-to Desecending Colon, Cecum seen from a distance);  Surgeon: Rogene Houston, MD;  Location: [facility];  Service: Endoscopy;  Laterality: N/A;  . Polypectomy  08/10/2014    Procedure: POLYPECTOMY;  Surgeon: Rogene Houston, MD;  Location: [facility];  Service: Endoscopy;;   Family History  Problem Relation Age of Onset  . Cancer Mother   . Cancer Other   . Heart failure Other    History  Substance Use Topics  . Smoking status: Current Every Day Smoker -- 1.00 packs/day for 27 years    Types: Cigarettes  . Smokeless tobacco: Never Used  . Alcohol Use: No  unemployed Smokes 1/2 ppd  Review of Systems  Genitourinary: Positive for difficulty urinating.  Musculoskeletal: Positive for back pain and arthralgias.  All other systems reviewed and are negative.     Allergies  Nickel  Home Medications   Prior to Admission medications   Medication Sig Start Date End Date Taking? Authorizing Provider  acetaminophen (TYLENOL) 500 MG tablet Take 1,000 mg by mouth every 6 (six) hours as needed for headache.    Historical Provider, MD  HYDROmorphone (DILAUDID) 4 MG tablet Take 1 tablet (4 mg total) by mouth every 6 (six) hours as needed for  severe pain. Patient not taking: Reported on 08/14/2014 07/12/14   Milton Ferguson, MD   Triage vitals: BP 111/75 mmHg  Pulse 90  Temp(Src) 99.5 F (37.5 C) (Oral)  Resp 18  Ht 5\' 9"  (1.753 m)  Wt 180 lb (81.647 kg)  BMI 26.57 kg/m2  SpO2 97%  Vital signs normal   Physical Exam  Constitutional: He is oriented to person, place, and time. He appears well-developed and well-nourished.  Non-toxic appearance. He does not appear ill. No distress.  dramatic  HENT:  Head: Normocephalic and atraumatic.  Right Ear: External ear normal.  Left Ear: External ear normal.  Nose: Nose normal. No mucosal edema or rhinorrhea.  Mouth/Throat: Oropharynx is clear and moist and mucous membranes are normal. No dental abscesses or uvula swelling.  Eyes:  Conjunctivae and EOM are normal. Pupils are equal, round, and reactive to light.  Neck: Normal range of motion and full passive range of motion without pain. Neck supple.  Pulmonary/Chest: Effort normal and breath sounds normal. No respiratory distress. He has no rhonchi. He exhibits no crepitus.  Abdominal: Normal appearance. He exhibits no distension. There is no tenderness. There is no rebound and no guarding.  Musculoskeletal: Normal range of motion. He exhibits no edema or tenderness.       Back:  Moves all extremities well. Tender in lower lumbar/sacral spine without pain on ROM. SLR negative bilaterally. Patellar reflexes are 2+ unequal.  Neurological: He is alert and oriented to person, place, and time. He has normal strength. No cranial nerve deficit.  Skin: Skin is warm, dry and intact. No rash noted. No erythema. No pallor.  Psychiatric: His mood appears anxious. His speech is rapid and/or pressured. He is agitated.  Nursing note and vitals reviewed.   ED Course  Procedures   Medications  ketorolac (TORADOL) injection 60 mg (60 mg Intramuscular Given 09/13/14 0030)  diazepam (VALIUM) injection 5 mg (5 mg Intravenous Given 09/13/14 0029)    DIAGNOSTIC STUDIES: Oxygen Saturation is 97% on RA, normal by my interpretation.  COORDINATION OF CARE:  12:08 AM Discussed treatment plan which includes medication for pain and referrals to a PCP with a follow up with pt at bedside and pt agreed to plan.   Review of patient's chart shows he had a MRI in 2002 when he was 39 years old for his back that was normal. His last lumbar spine x-rays were in December 2014 and were read as normal. Patient has had 6 ED visits this year. He has been seen for back pain for several years.  Patient was just seen in the ED on May 3 for same complaints. At that time he said he had been on methadone for his pain however he is not on it now. At that time he had a Education officer, museum talk to him to try to help and get a  PCP. He was denied narcotic pain medication at that visit.  Patient has a history of polysubstance abuse.  Labs Review Labs Reviewed - No data to display  Imaging Review No results found.   EKG Interpretation None      MDM   Final diagnoses:  Chronic back pain   Discharge Medication List as of 09/13/2014 12:20 AM    START taking these medications   Details  cyclobenzaprine (FLEXERIL) 10 MG tablet Take 1 tablet (10 mg total) by mouth 3 (three) times daily as needed for muscle spasms., Starting 09/13/2014, Until Discontinued, Print    naproxen (NAPROSYN) 500 MG  tablet Take 1 po BID with food prn pain, Print         Plan discharge  Rolland Porter, MD, FACEP   I personally performed the services described in this documentation, which was scribed in my presence. The recorded information has been reviewed and considered.  Rolland Porter, MD, Barbette Or, MD 09/13/14 [phone]

## 2014-09-12 NOTE — ED Notes (Signed)
Pt co "internal pain past my spine", pt tearful at triage, pt states hurts in the middle more than left or right.

## 2014-09-13 MED ORDER — NAPROXEN 500 MG PO TABS: ORAL_TABLET | ORAL | Status: DC

## 2014-09-13 MED ORDER — CYCLOBENZAPRINE HCL 10 MG PO TABS
10.0000 mg | ORAL_TABLET | Freq: Three times a day (TID) | ORAL | Status: DC | PRN
Start: 2014-09-13 — End: 2014-12-18

## 2014-09-13 MED ORDER — KETOROLAC TROMETHAMINE 60 MG/2ML IM SOLN
60.0000 mg | Freq: Once | INTRAMUSCULAR | Status: AC
  Administered 2014-09-13: 60 mg via INTRAMUSCULAR
  Filled 2014-09-13: qty 2

## 2014-09-13 MED ORDER — DIAZEPAM 5 MG/ML IJ SOLN
5.0000 mg | Freq: Once | INTRAMUSCULAR | Status: AC
  Administered 2014-09-13: 5 mg via INTRAVENOUS
  Filled 2014-09-13: qty 2

## 2014-09-13 NOTE — Discharge Instructions (Signed)
Try ice and heat to your back. Take the medication as prescribed. You need to get a primary care doctor to help manage your chronic back pain. Your MRI in 2002 was normal and your most recent xrays of your back were normal.    Back Exercises Back exercises help treat and prevent back injuries. The goal is to increase your strength in your belly (abdominal) and back muscles. These exercises can also help with flexibility. Start these exercises when told by your doctor. HOME CARE Back exercises include: Pelvic Tilt.  Lie on your back with your knees bent. Tilt your pelvis until the lower part of your back is against the floor. Hold this position 5 to 10 sec. Repeat this exercise 5 to 10 times. Knee to Chest.  Pull 1 knee up against your chest and hold for 20 to 30 seconds. Repeat this with the other knee. This may be done with the other leg straight or bent, whichever feels better. Then, pull both knees up against your chest. Sit-Ups or Curl-Ups.  Bend your knees 90 degrees. Start with tilting your pelvis, and do a partial, slow sit-up. Only lift your upper half 30 to 45 degrees off the floor. Take at least 2 to 3 seonds for each sit-up. Do not do sit-ups with your knees out straight. If partial sit-ups are difficult, simply do the above but with only tightening your belly (abdominal) muscles and holding it as told. Hip-Lift.  Lie on your back with your knees flexed 90 degrees. Push down with your feet and shoulders as you raise your hips 2 inches off the floor. Hold for 10 seconds, repeat 5 to 10 times. Back Arches.  Lie on your stomach. Prop yourself up on bent elbows. Slowly press on your hands, causing an arch in your low back. Repeat 3 to 5 times. Shoulder-Lifts.  Lie face down with arms beside your body. Keep hips and belly pressed to floor as you slowly lift your head and shoulders off the floor. Do not overdo your exercises. Be careful in the beginning. Exercises may cause you some  mild back discomfort. If the pain lasts for more than 15 minutes, stop the exercises until you see your doctor. Improvement with exercise for back problems is slow.  Document Released: 05/02/2010 Document Revised: 06/22/2011 Document Reviewed: 01/29/2011 [facility] Patient Information 2015 [place], [place]. This information is not intended to replace advice given to you by your health care provider. Make sure you discuss any questions you have with your health care provider.

## 2014-12-08 ENCOUNTER — Encounter (HOSPITAL_COMMUNITY): Payer: Self-pay | Admitting: *Deleted

## 2014-12-08 ENCOUNTER — Emergency Department (HOSPITAL_COMMUNITY)
Admission: EM | Admit: 2014-12-08 | Discharge: 2014-12-09 | Discharge status: Against Medical Advice | Payer: Self-pay | Attending: Emergency Medicine | Admitting: Emergency Medicine

## 2014-12-08 DIAGNOSIS — F329 Major depressive disorder, single episode, unspecified: Secondary | ICD-10-CM | POA: Insufficient documentation

## 2014-12-08 DIAGNOSIS — R7989 Other specified abnormal findings of blood chemistry: Secondary | ICD-10-CM

## 2014-12-08 DIAGNOSIS — R110 Nausea: Secondary | ICD-10-CM | POA: Insufficient documentation

## 2014-12-08 DIAGNOSIS — F1110 Opioid abuse, uncomplicated: Secondary | ICD-10-CM | POA: Insufficient documentation

## 2014-12-08 DIAGNOSIS — F1310 Sedative, hypnotic or anxiolytic abuse, uncomplicated: Secondary | ICD-10-CM | POA: Insufficient documentation

## 2014-12-08 DIAGNOSIS — Z720 Tobacco use: Secondary | ICD-10-CM | POA: Insufficient documentation

## 2014-12-08 DIAGNOSIS — H53149 Visual discomfort, unspecified: Secondary | ICD-10-CM | POA: Insufficient documentation

## 2014-12-08 DIAGNOSIS — F1210 Cannabis abuse, uncomplicated: Secondary | ICD-10-CM | POA: Insufficient documentation

## 2014-12-08 DIAGNOSIS — M549 Dorsalgia, unspecified: Secondary | ICD-10-CM

## 2014-12-08 DIAGNOSIS — F1910 Other psychoactive substance abuse, uncomplicated: Secondary | ICD-10-CM

## 2014-12-08 DIAGNOSIS — Z7289 Other problems related to lifestyle: Secondary | ICD-10-CM | POA: Insufficient documentation

## 2014-12-08 DIAGNOSIS — Z765 Malingerer [conscious simulation]: Secondary | ICD-10-CM

## 2014-12-08 DIAGNOSIS — G8929 Other chronic pain: Secondary | ICD-10-CM

## 2014-12-08 DIAGNOSIS — R195 Other fecal abnormalities: Secondary | ICD-10-CM | POA: Insufficient documentation

## 2014-12-08 DIAGNOSIS — R319 Hematuria, unspecified: Secondary | ICD-10-CM | POA: Insufficient documentation

## 2014-12-08 DIAGNOSIS — R3919 Other difficulties with micturition: Secondary | ICD-10-CM | POA: Insufficient documentation

## 2014-12-08 DIAGNOSIS — R51 Headache: Secondary | ICD-10-CM | POA: Insufficient documentation

## 2014-12-08 DIAGNOSIS — M545 Low back pain: Secondary | ICD-10-CM | POA: Insufficient documentation

## 2014-12-08 DIAGNOSIS — F419 Anxiety disorder, unspecified: Secondary | ICD-10-CM | POA: Insufficient documentation

## 2014-12-08 MED ORDER — IBUPROFEN 800 MG PO TABS
800.0000 mg | ORAL_TABLET | Freq: Once | ORAL | Status: DC
  Filled 2014-12-08: qty 1

## 2014-12-08 MED ORDER — CYCLOBENZAPRINE HCL 10 MG PO TABS
10.0000 mg | ORAL_TABLET | Freq: Once | ORAL | Status: DC
  Filled 2014-12-08: qty 1

## 2014-12-08 NOTE — ED Notes (Signed)
Pt c/o problems with urination, back pain, leg pain, states that he has had pain for over a year and is tired of dealing with it at home,

## 2014-12-08 NOTE — ED Provider Notes (Signed)
CSN: [id number]     Arrival date & time 12/08/14  2259 History  This chart was scribed for Nathan Fuel, MD by Irene Pap, ED Scribe. This patient was seen in room APA16A/[facility]16A and patient care was started at 11:38 PM.    Chief Complaint  Patient presents with  . Back Pain   The history is provided by the patient. No language interpreter was used.   HPI Comments: Nathan Mckay is a 39 y.o. Male with hx of chronic back pain, sciatica, fibromyalgia, and polysubstance abuse who presents to the Emergency Department complaining of hematuria onset 2 years ago, hematochezia, gradually worsening, constant, radiating bilateral leg pain to the lumbar spine, nausea, chronic generalized sharp, dull headache and back pain. Reports worsening headache with light and sound. He reports taking Tylenol to some relief, but the pain will come back after a short amount of time. He states that getting prescriptions for pain medication at the hospital is what works for him and has not been to the pain clinic for pain management. He reports associated intermittent difficulty urinating. He reports that he has been checked for his symptoms multiple times but that they have not resolved. He reports hx of polyps being removed when complaining of hematochezia and was diagnosed with hemorrhoids. He states that it has been bright and dark red on top of the stool today. He states that the pain makes him feel "like I cannot handle life anymore; I don't know where to go. The only thing I know that works is medication, but it makes me look like something I'm not." Pt denies SI. Pt does not know who his PCP is. Pt states that he is very stressed about life.   Past Medical History  Diagnosis Date  . Chronic headaches   . Chronic back pain   . Polysubstance abuse   . Recurrent shoulder dislocation   . Sciatica   . Fibromyalgia   . Seizures     states epilepsy was dx but they stopped his meds for seizures and he stopped his  meds and the seizures stopped. last seizure was a year ago.   Past Surgical History  Procedure Laterality Date  . Hand surgery Right     broken hand  . Wrist surgery Right     fractured hand and wrist.  . Colonoscopy with propofol N/A 08/10/2014    Procedure: COLONOSCOPY WITH PROPOFOL (Incomplete exam-to Desecending Colon, Cecum seen from a distance);  Surgeon: Rogene Houston, MD;  Location: [facility];  Service: Endoscopy;  Laterality: N/A;  . Polypectomy  08/10/2014    Procedure: POLYPECTOMY;  Surgeon: Rogene Houston, MD;  Location: [facility];  Service: Endoscopy;;   Family History  Problem Relation Age of Onset  . Cancer Mother   . Cancer Other   . Heart failure Other    Social History  Substance Use Topics  . Smoking status: Current Every Day Smoker -- 1.00 packs/day for 27 years    Types: Cigarettes  . Smokeless tobacco: Never Used  . Alcohol Use: No    Review of Systems  Eyes: Positive for photophobia.  Gastrointestinal: Positive for nausea and blood in stool.  Genitourinary: Positive for hematuria and difficulty urinating.  Musculoskeletal: Positive for back pain and arthralgias.  Neurological: Positive for headaches.  Psychiatric/Behavioral: Negative for suicidal ideas. The patient is nervous/anxious.   All other systems reviewed and are negative.  Allergies  Nickel  Home Medications   Prior to Admission medications  Medication Sig Start Date End Date Taking? Authorizing Provider  acetaminophen (TYLENOL) 500 MG tablet Take 1,000 mg by mouth every 6 (six) hours as needed for headache.    Historical Provider, MD  cyclobenzaprine (FLEXERIL) 10 MG tablet Take 1 tablet (10 mg total) by mouth 3 (three) times daily as needed for muscle spasms. 09/13/14   Rolland Porter, MD  HYDROmorphone (DILAUDID) 4 MG tablet Take 1 tablet (4 mg total) by mouth every 6 (six) hours as needed for severe pain. Patient not taking: Reported on 08/14/2014 07/12/14   Milton Ferguson, MD  naproxen (NAPROSYN)  500 MG tablet Take 1 po BID with food prn pain 09/13/14   Rolland Porter, MD   BP 102/70 mmHg  Pulse 121  Temp(Src) 98 F (36.7 C) (Oral)  Resp 20  Ht 5\' 9"  (1.753 m)  Wt 180 lb (81.647 kg)  BMI 26.57 kg/m2  SpO2 97%  Physical Exam  Constitutional: He is oriented to person, place, and time. He appears well-developed and well-nourished.  HENT:  Head: Normocephalic and atraumatic.  Eyes: EOM are normal. Pupils are equal, round, and reactive to light.  Neck: Normal range of motion. Neck supple. No JVD present.  Cardiovascular: Normal rate.   Pulmonary/Chest: Effort normal and breath sounds normal. He has no wheezes. He has no rales. He exhibits no tenderness.  Abdominal: Soft. Bowel sounds are normal. He exhibits no distension and no mass. There is no tenderness.  Musculoskeletal: Normal range of motion.  Back non-tender, negative SLR; no CVA tenderness  Lymphadenopathy:    He has no cervical adenopathy.  Neurological: He is alert and oriented to person, place, and time. No cranial nerve deficit. He exhibits normal muscle tone. Coordination normal.  Skin: Skin is warm and dry. No rash noted.  Psychiatric: He exhibits a depressed mood. He expresses no suicidal ideation.  Appears moderately depressed but no suicidal thoughts  Nursing note and vitals reviewed.   ED Course  Procedures (including critical care time) DIAGNOSTIC STUDIES: Oxygen Saturation is 97% on RA, normal by my interpretation.    COORDINATION OF CARE: 11:48 PM-Discussed treatment plan which includes labs with pt at bedside and pt agreed to plan.   Labs Review Results for orders placed or performed during the hospital encounter of 12/08/14  Comprehensive metabolic panel  Result Value Ref Range   Sodium 140 135 - 145 mmol/L   Potassium 4.4 3.5 - 5.1 mmol/L   Chloride 104 101 - 111 mmol/L   CO2 28 22 - 32 mmol/L   Glucose, Bld 77 65 - 99 mg/dL   BUN 20 6 - 20 mg/dL   Creatinine, Ser 1.27 (H) 0.61 - 1.24 mg/dL    Calcium 9.6 8.9 - 10.3 mg/dL   Total Protein 8.0 6.5 - 8.1 g/dL   Albumin 4.7 3.5 - 5.0 g/dL   AST 22 15 - 41 U/L   ALT 19 17 - 63 U/L   Alkaline Phosphatase 73 38 - 126 U/L   Total Bilirubin 0.5 0.3 - 1.2 mg/dL   GFR calc non Af Amer >60 >60 mL/min   GFR calc Af Amer >60 >60 mL/min   Anion gap 8 5 - 15  CBC with Differential  Result Value Ref Range   WBC 8.0 4.0 - 10.5 K/uL   RBC 5.08 4.22 - 5.81 MIL/uL   Hemoglobin 15.3 13.0 - 17.0 g/dL   HCT 44.9 39.0 - 52.0 %   MCV 88.4 78.0 - 100.0 fL   MCH 30.1 26.0 -  34.0 pg   MCHC 34.1 30.0 - 36.0 g/dL   RDW 12.6 11.5 - 15.5 %   Platelets 173 150 - 400 K/uL   Neutrophils Relative % 45 43 - 77 %   Neutro Abs 3.7 1.7 - 7.7 K/uL   Lymphocytes Relative 44 12 - 46 %   Lymphs Abs 3.5 0.7 - 4.0 K/uL   Monocytes Relative 7 3 - 12 %   Monocytes Absolute 0.5 0.1 - 1.0 K/uL   Eosinophils Relative 3 0 - 5 %   Eosinophils Absolute 0.3 0.0 - 0.7 K/uL   Basophils Relative 1 0 - 1 %   Basophils Absolute 0.1 0.0 - 0.1 K/uL  Urinalysis, Routine w reflex microscopic (not at [facility])  Result Value Ref Range   Color, Urine YELLOW YELLOW   APPearance CLEAR CLEAR   Specific Gravity, Urine >1.030 (H) 1.005 - 1.030   pH 5.0 5.0 - 8.0   Glucose, UA NEGATIVE NEGATIVE mg/dL   Hgb urine dipstick LARGE (A) NEGATIVE   Bilirubin Urine NEGATIVE NEGATIVE   Ketones, ur NEGATIVE NEGATIVE mg/dL   Protein, ur NEGATIVE NEGATIVE mg/dL   Urobilinogen, UA 0.2 0.0 - 1.0 mg/dL   Nitrite NEGATIVE NEGATIVE   Leukocytes, UA NEGATIVE NEGATIVE  Urine rapid drug screen (hosp performed)  Result Value Ref Range   Opiates POSITIVE (A) NONE DETECTED   Cocaine NONE DETECTED NONE DETECTED   Benzodiazepines POSITIVE (A) NONE DETECTED   Amphetamines NONE DETECTED NONE DETECTED   Tetrahydrocannabinol POSITIVE (A) NONE DETECTED   Barbiturates NONE DETECTED NONE DETECTED  Urine microscopic-add on  Result Value Ref Range   Squamous Epithelial / LPF RARE RARE   WBC, UA 0-2 <3 WBC/hpf    RBC / HPF TOO NUMEROUS TO COUNT <3 RBC/hpf   Bacteria, UA FEW (A) RARE   Urine-Other MUCOUS PRESENT      MDM   Final diagnoses:  Chronic back pain  Drug-seeking behavior  Polydrug abuse  Elevated serum creatinine    Chronic back pain. Patient tells me that Demerol and Dilaudid of the only medicines help his pain. I reviewed his past records and he has multiple visits for back pain but has rarely, if ever, and prescribed narcotics. He does express depression but no suicidal ideations I do not feel he needs emergent psychiatric hospitalization. Screening labs are obtained in his given a dose of ibuprofen and cyclobenzaprine. Patient was very upset with this and insisted that these will not help him and he needs his dialogue noted. I've asked him what medications he he has taken recently and he states that he did take some Xanax which was from an old prescription but adamantly denied taking any narcotic pain killers. Drug screen is come back positive for benzodiazepine, THC, and opiates. Have explained this to the patient. I did review his record on the [place] controlled substance reporting website and he has no narcotic prescriptions in the last filled in the last 6 months. However, he is clearly getting narcotics from illicit sources. I have offered him a referral for outpatient management of his drug abuse as well as referral to a nor surgeon, but he states that his problem is that he cannot get anywhere because he does not have a car and wishes to be admitted. I have advised him that there is no indication for admission and also no indication for narcotic prescriptions even his lack of fearfulness about recent narcotic use. When I returned to bring up his instructions including referrals,  he left before they could be given to him. Had also plan to given prescriptions for naproxen and cyclobenzaprine but he clearly did not want those medications.  I personally performed the services  described in this documentation, which was scribed in my presence. The recorded information has been reviewed and is accurate.     Nathan Fuel, MD 41/[id number]

## 2014-12-09 LAB — CBC WITH DIFFERENTIAL/PLATELET
Basophils Absolute: 0.1 10*3/uL (ref 0.0–0.1)
Basophils Relative: 1 % (ref 0–1)
EOS PCT: 3 % (ref 0–5)
Eosinophils Absolute: 0.3 10*3/uL (ref 0.0–0.7)
HCT: 44.9 % (ref 39.0–52.0)
Hemoglobin: 15.3 g/dL (ref 13.0–17.0)
LYMPHS PCT: 44 % (ref 12–46)
Lymphs Abs: 3.5 10*3/uL (ref 0.7–4.0)
MCH: 30.1 pg (ref 26.0–34.0)
MCHC: 34.1 g/dL (ref 30.0–36.0)
MCV: 88.4 fL (ref 78.0–100.0)
MONO ABS: 0.5 10*3/uL (ref 0.1–1.0)
Monocytes Relative: 7 % (ref 3–12)
Neutro Abs: 3.7 10*3/uL (ref 1.7–7.7)
Neutrophils Relative %: 45 % (ref 43–77)
PLATELETS: 173 10*3/uL (ref 150–400)
RBC: 5.08 MIL/uL (ref 4.22–5.81)
RDW: 12.6 % (ref 11.5–15.5)
WBC: 8 10*3/uL (ref 4.0–10.5)

## 2014-12-09 LAB — URINE MICROSCOPIC-ADD ON

## 2014-12-09 LAB — COMPREHENSIVE METABOLIC PANEL
ALBUMIN: 4.7 g/dL (ref 3.5–5.0)
ALT: 19 U/L (ref 17–63)
AST: 22 U/L (ref 15–41)
Alkaline Phosphatase: 73 U/L (ref 38–126)
Anion gap: 8 (ref 5–15)
BILIRUBIN TOTAL: 0.5 mg/dL (ref 0.3–1.2)
BUN: 20 mg/dL (ref 6–20)
CALCIUM: 9.6 mg/dL (ref 8.9–10.3)
CO2: 28 mmol/L (ref 22–32)
Chloride: 104 mmol/L (ref 101–111)
Creatinine, Ser: 1.27 mg/dL — ABNORMAL HIGH (ref 0.61–1.24)
GFR calc Af Amer: >60 mL/min (ref >60)
GFR calc non Af Amer: >60 mL/min (ref >60)
GLUCOSE: 77 mg/dL (ref 65–99)
Potassium: 4.4 mmol/L (ref 3.5–5.1)
Sodium: 140 mmol/L (ref 135–145)
Total Protein: 8 g/dL (ref 6.5–8.1)

## 2014-12-09 LAB — URINALYSIS, ROUTINE W REFLEX MICROSCOPIC
BILIRUBIN URINE: NEGATIVE
Glucose, UA: NEGATIVE mg/dL
Ketones, ur: NEGATIVE mg/dL
Leukocytes, UA: NEGATIVE
NITRITE: NEGATIVE
PH: 5 (ref 5.0–8.0)
Protein, ur: NEGATIVE mg/dL
Specific Gravity, Urine: >1.03 — ABNORMAL HIGH (ref 1.005–1.030)
Urobilinogen, UA: 0.2 mg/dL (ref 0.0–1.0)

## 2014-12-09 LAB — RAPID URINE DRUG SCREEN, HOSP PERFORMED
Amphetamines: NOT DETECTED
Barbiturates: NOT DETECTED
Benzodiazepines: POSITIVE — AB
Cocaine: NOT DETECTED
Opiates: POSITIVE — AB
Tetrahydrocannabinol: POSITIVE — AB

## 2014-12-09 NOTE — ED Notes (Signed)
Upon entering patient's room, he was trying to access the computer. Patient seems unsteady on his feet.

## 2014-12-09 NOTE — ED Notes (Signed)
Patient sitting in room with a cigarette hanging out of his mouth.  Lighter in his hand. Patient has the clock off the wall and is hold it in his hands, staring at it.

## 2014-12-09 NOTE — ED Notes (Signed)
Patient states that the medication will not help me and that he does not want it.

## 2014-12-14 ENCOUNTER — Emergency Department (HOSPITAL_COMMUNITY): Payer: Self-pay

## 2014-12-14 ENCOUNTER — Encounter (HOSPITAL_COMMUNITY): Payer: Self-pay | Admitting: *Deleted

## 2014-12-14 ENCOUNTER — Emergency Department (HOSPITAL_COMMUNITY)
Admission: EM | Admit: 2014-12-14 | Discharge: 2014-12-14 | Disposition: A | Discharge status: Home / Self Care | Payer: Self-pay | Attending: Physician Assistant | Admitting: Physician Assistant

## 2014-12-14 DIAGNOSIS — M797 Fibromyalgia: Secondary | ICD-10-CM | POA: Insufficient documentation

## 2014-12-14 DIAGNOSIS — Z8659 Personal history of other mental and behavioral disorders: Secondary | ICD-10-CM | POA: Insufficient documentation

## 2014-12-14 DIAGNOSIS — Z720 Tobacco use: Secondary | ICD-10-CM | POA: Insufficient documentation

## 2014-12-14 DIAGNOSIS — S92411A Displaced fracture of proximal phalanx of right great toe, initial encounter for closed fracture: Secondary | ICD-10-CM | POA: Insufficient documentation

## 2014-12-14 DIAGNOSIS — Y998 Other external cause status: Secondary | ICD-10-CM | POA: Insufficient documentation

## 2014-12-14 DIAGNOSIS — G8929 Other chronic pain: Secondary | ICD-10-CM | POA: Insufficient documentation

## 2014-12-14 DIAGNOSIS — X58XXXA Exposure to other specified factors, initial encounter: Secondary | ICD-10-CM | POA: Insufficient documentation

## 2014-12-14 DIAGNOSIS — Y9289 Other specified places as the place of occurrence of the external cause: Secondary | ICD-10-CM | POA: Insufficient documentation

## 2014-12-14 DIAGNOSIS — S92911A Unspecified fracture of right toe(s), initial encounter for closed fracture: Secondary | ICD-10-CM

## 2014-12-14 DIAGNOSIS — Y9389 Activity, other specified: Secondary | ICD-10-CM | POA: Insufficient documentation

## 2014-12-14 NOTE — ED Notes (Signed)
Pt c/o right toe pain.

## 2014-12-14 NOTE — Discharge Instructions (Signed)
Buddy Taping of Toes °We have taped your toes together to keep them from moving. This is called "buddy taping" since we used a part of your own body to keep the injured part still. We placed soft padding between your toes to keep them from rubbing against each other. Buddy taping will help with healing and to reduce pain. Keep your toes buddy taped together for as long as directed by your caregiver. °HOME CARE INSTRUCTIONS  °· Raise your injured area above the level of your heart while sitting or lying down. Prop it up with pillows. °· An ice pack used every twenty minutes, while awake, for the first one to two days may be helpful. Put ice in a plastic bag and put a towel between the bag and your skin. °· Watch for signs that the taping is too tight. These signs may be: °· Numbness of your taped toes. °· Coolness of your taped toes. °· Color change in the area beyond the tape. °· Increased pain. °· If you have any of these signs, loosen or rewrap the tape. If you need to loosen or rewrap the buddy tape, make sure you use the padding again. °SEEK IMMEDIATE MEDICAL CARE IF:  °· You have worse pain, swelling, inflammation (soreness), drainage or bleeding after you rewrap the tape. °· Any new problems occur. °MAKE SURE YOU:  °· Understand these instructions. °· Will watch your condition. °· Will get help right away if you are not doing well or get worse. °Document Released: 01/02/2004 Document Revised: 06/22/2011 Document Reviewed: 03/27/2008 °ExitCare® Patient Information ©2015 [employer]. This information is not intended to replace advice given to you by your health care provider. Make sure you discuss any questions you have with your health care provider. ° °Toe Fracture °Your caregiver has diagnosed you as having a fractured toe. A toe fracture is a break in the bone of a toe. "Buddy taping" is a way of splinting your broken toe, by taping the broken toe to the toe next to it. This "buddy taping" will keep the  injured toe from moving beyond normal range of motion. Buddy taping also helps the toe heal in a more normal alignment. It may take 6 to 8 weeks for the toe injury to heal. °HOME CARE INSTRUCTIONS  °· Leave your toes taped together for as long as directed by your caregiver or until you see a doctor for a follow-up examination. You can change the tape after bathing. Always use a small piece of gauze or cotton between the toes when taping them together. This will help the skin stay dry and prevent infection. °· Apply ice to the injury for 15-20 minutes each hour while awake for the first 2 days. Put the ice in a plastic bag and place a towel between the bag of ice and your skin. °· After the first 2 days, apply heat to the injured area. Use heat for the next 2 to 3 days. Place a heating pad on the foot or soak the foot in warm water as directed by your caregiver. °· Keep your foot elevated as much as possible to lessen swelling. °· Wear sturdy, supportive shoes. The shoes should not pinch the toes or fit tightly against the toes. °· Your caregiver may prescribe a rigid shoe if your foot is very swollen. °· Your may be given crutches if the pain is too great and it hurts too much to walk. °· Only take over-the-counter or prescription medicines for pain, discomfort,   or fever as directed by your caregiver. °· If your caregiver has given you a follow-up appointment, it is very important to keep that appointment. Not keeping the appointment could result in a chronic or permanent injury, pain, and disability. If there is any problem keeping the appointment, you must call back to this facility for assistance. °SEEK MEDICAL CARE IF:  °· You have increased pain or swelling, not relieved with medications. °· The pain does not get better after 1 week. °· Your injured toe is cold when the others are warm. °SEEK IMMEDIATE MEDICAL CARE IF:  °· The toe becomes cold, numb, or white. °· The toe becomes hot (inflamed) and  red. °Document Released: 03/27/2000 Document Revised: 06/22/2011 Document Reviewed: 11/14/2007 °ExitCare® Patient Information ©2015 [employer]. This information is not intended to replace advice given to you by your health care provider. Make sure you discuss any questions you have with your health care provider. ° °

## 2014-12-14 NOTE — ED Provider Notes (Signed)
CSN: [id number]     Arrival date & time 12/14/14  2004 History   First MD Initiated Contact with Patient 12/14/14 2015     Chief Complaint  Patient presents with  . Toe Pain     (Consider location/radiation/quality/duration/timing/severity/associated sxs/prior Treatment) HPI Comments: Patient with a history of chronic pain, polysubstance abuse presents with severe right great toe pain after a hyperextension injury occurring earlier today while moving a washing machine. No other injury.   Patient is a 39 y.o. male presenting with toe pain. The history is provided by the patient. No language interpreter was used.  Toe Pain This is a new problem. The current episode started today. Pertinent negatives include no fever or numbness.    Past Medical History  Diagnosis Date  . Chronic headaches   . Chronic back pain   . Polysubstance abuse   . Recurrent shoulder dislocation   . Sciatica   . Fibromyalgia   . Seizures     states epilepsy was dx but they stopped his meds for seizures and he stopped his meds and the seizures stopped. last seizure was a year ago.   Past Surgical History  Procedure Laterality Date  . Hand surgery Right     broken hand  . Wrist surgery Right     fractured hand and wrist.  . Colonoscopy with propofol N/A 08/10/2014    Procedure: COLONOSCOPY WITH PROPOFOL (Incomplete exam-to Desecending Colon, Cecum seen from a distance);  Surgeon: Rogene Houston, MD;  Location: [facility];  Service: Endoscopy;  Laterality: N/A;  . Polypectomy  08/10/2014    Procedure: POLYPECTOMY;  Surgeon: Rogene Houston, MD;  Location: [facility];  Service: Endoscopy;;   Family History  Problem Relation Age of Onset  . Cancer Mother   . Cancer Other   . Heart failure Other    Social History  Substance Use Topics  . Smoking status: Current Every Day Smoker -- 1.00 packs/day for 27 years    Types: Cigarettes  . Smokeless tobacco: Never Used  . Alcohol Use: No    Review of Systems   Constitutional: Negative for fever.  Musculoskeletal:       See HPI.  Skin: Negative for color change and wound.  Neurological: Negative for numbness.      Allergies  Nickel  Home Medications   Prior to Admission medications   Medication Sig Start Date End Date Taking? Authorizing Provider  acetaminophen (TYLENOL) 500 MG tablet Take 1,000 mg by mouth every 6 (six) hours as needed for headache.   Yes Historical Provider, MD  cyclobenzaprine (FLEXERIL) 10 MG tablet Take 1 tablet (10 mg total) by mouth 3 (three) times daily as needed for muscle spasms. Patient not taking: Reported on 12/14/2014 09/13/14   Rolland Porter, MD  HYDROmorphone (DILAUDID) 4 MG tablet Take 1 tablet (4 mg total) by mouth every 6 (six) hours as needed for severe pain. Patient not taking: Reported on 08/14/2014 07/12/14   Milton Ferguson, MD  naproxen (NAPROSYN) 500 MG tablet Take 1 po BID with food prn pain Patient not taking: Reported on 12/14/2014 09/13/14   Rolland Porter, MD   BP 95/63 mmHg  Pulse 96  Temp(Src) 97.4 F (36.3 C) (Oral)  Resp 18  Ht 5\' 9"  (1.753 m)  Wt 180 lb (81.647 kg)  BMI 26.57 kg/m2  SpO2 99% Physical Exam  Constitutional: He is oriented to person, place, and time. He appears well-developed and well-nourished. No distress.  Pulmonary/Chest: Effort normal.  Musculoskeletal:  Normal range of motion. He exhibits no edema.  Right great toe is not swollen or discolored. There is no deformity. Nail is intact without subungual hematoma.   Neurological: He is oriented to person, place, and time.  Skin: Skin is warm and dry.    ED Course  Procedures (including critical care time) Labs Review Labs Reviewed - No data to display  Imaging Review No results found. I have personally reviewed and evaluated these images and lab results as part of my medical decision-making.   EKG Interpretation None      MDM   Final diagnoses:  None    1. Toe fracture.  Chart reviewed. [place] Controlled Substance  database consulted. The patient does not have any filled prescriptions in the database, however, has multiple drug screens positive for opiates, benzodiazepines, and TCN indicating that he is obtaining controlled medications without prescriptions.   Patient has an intra-articular fracture of the toe. Discussed results with the patient, recommended allowing [place] to buddy-tape the toe. Recommended Tylenol and/or ibuprofen for pain. The patient left without waiting for discharge paperwork.  Charlann Lange, PA-C 12/14/14 2132  Courteney Julio Alm, MD 12/14/14 [phone]

## 2014-12-14 NOTE — ED Notes (Signed)
Pt refused to stay for paper- work stated that he had wasted enough time and we could sign it as he was leaving  .Ambulated off unit without any signs of discomfort

## 2014-12-14 NOTE — ED Notes (Signed)
Pt offered some ibuprofen for pain - declined - stated that he needed something else - "a shot" , or a 6 pack of percocets or Valium.

## 2014-12-17 ENCOUNTER — Emergency Department (HOSPITAL_COMMUNITY)
Admission: EM | Admit: 2014-12-17 | Discharge: 2014-12-18 | Disposition: A | Discharge status: Home / Self Care | Payer: Self-pay | Attending: Emergency Medicine | Admitting: Emergency Medicine

## 2014-12-17 ENCOUNTER — Encounter (HOSPITAL_COMMUNITY): Payer: Self-pay | Admitting: Emergency Medicine

## 2014-12-17 DIAGNOSIS — X58XXXD Exposure to other specified factors, subsequent encounter: Secondary | ICD-10-CM | POA: Insufficient documentation

## 2014-12-17 DIAGNOSIS — R062 Wheezing: Secondary | ICD-10-CM | POA: Insufficient documentation

## 2014-12-17 DIAGNOSIS — Z720 Tobacco use: Secondary | ICD-10-CM | POA: Insufficient documentation

## 2014-12-17 DIAGNOSIS — G8929 Other chronic pain: Secondary | ICD-10-CM | POA: Insufficient documentation

## 2014-12-17 DIAGNOSIS — S92411D Displaced fracture of proximal phalanx of right great toe, subsequent encounter for fracture with routine healing: Secondary | ICD-10-CM | POA: Insufficient documentation

## 2014-12-17 DIAGNOSIS — Z8659 Personal history of other mental and behavioral disorders: Secondary | ICD-10-CM | POA: Insufficient documentation

## 2014-12-17 DIAGNOSIS — K625 Hemorrhage of anus and rectum: Secondary | ICD-10-CM | POA: Insufficient documentation

## 2014-12-17 DIAGNOSIS — S92911D Unspecified fracture of right toe(s), subsequent encounter for fracture with routine healing: Secondary | ICD-10-CM

## 2014-12-17 NOTE — ED Notes (Signed)
Pt claims he is passing blood in stool. States he had a polyp removed from endo procedure, now unsure if he has a Hemorid. Also claims his right great toe hurts.

## 2014-12-18 LAB — BASIC METABOLIC PANEL
ANION GAP: 5 (ref 5–15)
BUN: 10 mg/dL (ref 6–20)
CHLORIDE: 107 mmol/L (ref 101–111)
CO2: 29 mmol/L (ref 22–32)
CREATININE: 1.17 mg/dL (ref 0.61–1.24)
Calcium: 9.3 mg/dL (ref 8.9–10.3)
GFR calc non Af Amer: >60 mL/min (ref >60)
Glucose, Bld: 84 mg/dL (ref 65–99)
Potassium: 4 mmol/L (ref 3.5–5.1)
SODIUM: 141 mmol/L (ref 135–145)

## 2014-12-18 LAB — CBC WITH DIFFERENTIAL/PLATELET
BASOS ABS: 0 10*3/uL (ref 0.0–0.1)
BASOS PCT: 1 % (ref 0–1)
EOS ABS: 0.2 10*3/uL (ref 0.0–0.7)
Eosinophils Relative: 2 % (ref 0–5)
HCT: 42 % (ref 39.0–52.0)
HEMOGLOBIN: 14.1 g/dL (ref 13.0–17.0)
Lymphocytes Relative: 44 % (ref 12–46)
Lymphs Abs: 3.3 10*3/uL (ref 0.7–4.0)
MCH: 29.9 pg (ref 26.0–34.0)
MCHC: 33.6 g/dL (ref 30.0–36.0)
MCV: 89.2 fL (ref 78.0–100.0)
Monocytes Absolute: 0.3 10*3/uL (ref 0.1–1.0)
Monocytes Relative: 4 % (ref 3–12)
NEUTROS ABS: 3.7 10*3/uL (ref 1.7–7.7)
NEUTROS PCT: 49 % (ref 43–77)
Platelets: 209 10*3/uL (ref 150–400)
RBC: 4.71 MIL/uL (ref 4.22–5.81)
RDW: 12.7 % (ref 11.5–15.5)
WBC: 7.5 10*3/uL (ref 4.0–10.5)

## 2014-12-18 LAB — POC OCCULT BLOOD, ED: FECAL OCCULT BLD: NEGATIVE

## 2014-12-18 MED ORDER — NAPROXEN 500 MG PO TABS: ORAL_TABLET | ORAL | Status: DC

## 2014-12-18 NOTE — ED Provider Notes (Signed)
CSN: [id number]     Arrival date & time 12/17/14  2206 History   First MD Initiated Contact with Patient 12/18/14 0130    Chief Complaint  Patient presents with  . Toe Pain     (Consider location/radiation/quality/duration/timing/severity/associated sxs/prior Treatment) HPI patient reports he's been passing rectal blood "for a while". Reading his gastroenterologist notes it has been at least 10 years. He states he had a colonoscopy done sometime this year. He states he had a polyp removed and had hemorrhoids. He states he continues to have blood off and on. Sometimes when he wipes and sometimes on the stool. He does not describe any gross bleeding where he only passes blood. He denies abdominal pain.  Patient also states he was seen in ED 2 days ago after breaking his right great toe. Patient has great difficulty explaining to me how his injury happened. Basically he was helping someone move and he had a washer on a hand cart and somehow injured his toe. He states since then his toe is "popping loose".   Patient has a history of polysubstance abuse and comes to the ED frequently for chronic back pain. Due to this history he was not given narcotics for his fracture in his prior ED visit.  PCP none GI Dr Laural Golden  Past Medical History  Diagnosis Date  . Chronic headaches   . Chronic back pain   . Polysubstance abuse   . Recurrent shoulder dislocation   . Sciatica   . Fibromyalgia   . Seizures     states epilepsy was dx but they stopped his meds for seizures and he stopped his meds and the seizures stopped. last seizure was a year ago.   Past Surgical History  Procedure Laterality Date  . Hand surgery Right     broken hand  . Wrist surgery Right     fractured hand and wrist.  . Colonoscopy with propofol N/A 08/10/2014    Procedure: COLONOSCOPY WITH PROPOFOL (Incomplete exam-to Desecending Colon, Cecum seen from a distance);  Surgeon: Rogene Houston, MD;  Location: [facility];  Service:  Endoscopy;  Laterality: N/A;  . Polypectomy  08/10/2014    Procedure: POLYPECTOMY;  Surgeon: Rogene Houston, MD;  Location: [facility];  Service: Endoscopy;;   Family History  Problem Relation Age of Onset  . Cancer Mother   . Cancer Other   . Heart failure Other    Social History  Substance Use Topics  . Smoking status: Current Every Day Smoker -- 1.00 packs/day for 27 years    Types: Cigarettes  . Smokeless tobacco: Never Used  . Alcohol Use: No    Review of Systems  All other systems reviewed and are negative.     Allergies  Nickel  Home Medications   Prior to Admission medications   Medication Sig Start Date End Date Taking? Authorizing Provider  acetaminophen (TYLENOL) 500 MG tablet Take 1,000 mg by mouth every 6 (six) hours as needed for headache.   Yes Historical Provider, MD  naproxen (NAPROSYN) 500 MG tablet Take 1 po BID with food prn pain 12/18/14   Rolland Porter, MD   BP 109/76 mmHg  Pulse 70  Temp(Src) 98.1 F (36.7 C) (Oral)  Resp 16  Ht 5\' 9"  (1.753 m)  Wt 180 lb (81.647 kg)  BMI 26.57 kg/m2  SpO2 99%  Vital signs normal   Physical Exam  Constitutional: He is oriented to person, place, and time. He appears well-developed and well-nourished.  Non-toxic  appearance. He does not appear ill. No distress.  HENT:  Head: Normocephalic and atraumatic.  Right Ear: External ear normal.  Left Ear: External ear normal.  Nose: Nose normal. No mucosal edema or rhinorrhea.  Mouth/Throat: Mucous membranes are normal. No dental abscesses or uvula swelling.  Eyes: Conjunctivae and EOM are normal.  Neck: Normal range of motion and full passive range of motion without pain.  Pulmonary/Chest: Effort normal. No respiratory distress. He has wheezes. He has no rhonchi. He exhibits no crepitus.  Abdominal: Normal appearance.  Musculoskeletal: Normal range of motion. He exhibits no edema or tenderness.  Moves all extremities well. Patient's right great toe was examined. I do not  feel any laxity of any of the joints. There is some mild redness of the toe but there is no subungual hematoma. There are no lacerations or abrasions seen.  Neurological: He is alert and oriented to person, place, and time. He has normal strength. No cranial nerve deficit.  Skin: Skin is warm, dry and intact. No rash noted. No erythema. No pallor.  Psychiatric: He has a normal mood and affect. His speech is normal and behavior is normal. His mood appears not anxious.  Nursing note and vitals reviewed.   ED Course  Procedures (including critical care time)  Patient was placed in a postop shoe. I reviewed his x-ray and the fracture is very small. There is no reason his toe should be dislocating.  Review of patient's old chart shows in April he had a colonoscopy done. The exam was not complete because patient did not have a good prep. There was a small rectal polyp removed.  Labs Review Results for orders placed or performed during the hospital encounter of 12/17/14  CBC with Differential  Result Value Ref Range   WBC 7.5 4.0 - 10.5 K/uL   RBC 4.71 4.22 - 5.81 MIL/uL   Hemoglobin 14.1 13.0 - 17.0 g/dL   HCT 42.0 39.0 - 52.0 %   MCV 89.2 78.0 - 100.0 fL   MCH 29.9 26.0 - 34.0 pg   MCHC 33.6 30.0 - 36.0 g/dL   RDW 12.7 11.5 - 15.5 %   Platelets 209 150 - 400 K/uL   Neutrophils Relative % 49 43 - 77 %   Neutro Abs 3.7 1.7 - 7.7 K/uL   Lymphocytes Relative 44 12 - 46 %   Lymphs Abs 3.3 0.7 - 4.0 K/uL   Monocytes Relative 4 3 - 12 %   Monocytes Absolute 0.3 0.1 - 1.0 K/uL   Eosinophils Relative 2 0 - 5 %   Eosinophils Absolute 0.2 0.0 - 0.7 K/uL   Basophils Relative 1 0 - 1 %   Basophils Absolute 0.0 0.0 - 0.1 K/uL  Basic metabolic panel  Result Value Ref Range   Sodium 141 135 - 145 mmol/L   Potassium 4.0 3.5 - 5.1 mmol/L   Chloride 107 101 - 111 mmol/L   CO2 29 22 - 32 mmol/L   Glucose, Bld 84 65 - 99 mg/dL   BUN 10 6 - 20 mg/dL   Creatinine, Ser 1.17 0.61 - 1.24 mg/dL    Calcium 9.3 8.9 - 10.3 mg/dL   GFR calc non Af Amer >60 >60 mL/min   GFR calc Af Amer >60 >60 mL/min   Anion gap 5 5 - 15   Laboratory interpretation all normal   Imaging Review    Dg Toe Great Right  12/14/2014   CLINICAL DATA:  Dropped a washing machine full  of water on his right great toe today.  EXAM: RIGHT GREAT TOE  COMPARISON:  None.  FINDINGS: There is an acute fracture of the distal aspect of the first proximal phalanx with slight step-off in the articular surface at the interphalangeal joint. There is no dislocation. There is no radiopaque foreign body.  IMPRESSION: Intraarticular fracture at the distal aspect of the first proximal phalanx.   Electronically Signed   By: Andreas Newport M.D.   On: 12/14/2014 21:21   I have personally reviewed and evaluated these images and lab results as part of my medical decision-making.   EKG Interpretation None      MDM   Final diagnoses:  Toe fracture, right, with routine healing, subsequent encounter  PRB (rectal bleeding)    New Prescriptions   NAPROXEN (NAPROSYN) 500 MG TABLET    Take 1 po BID with food prn pain    Plan discharge  Rolland Porter, MD, Barbette Or, MD 12/18/14 [phone]

## 2014-12-18 NOTE — Discharge Instructions (Signed)
Elevate your foot. Use ice packs on your toe, wrap a towel around it so you don't get frost bite. Take the naproxen for pain. Wear the post op shoe until your toe fracture is healed enough you don't need it for pain relief. You can follow up with Dr Laural Golden about the blood in your stools.

## 2015-02-23 ENCOUNTER — Emergency Department (HOSPITAL_COMMUNITY)
Admission: EM | Admit: 2015-02-23 | Discharge: 2015-02-23 | Disposition: A | Discharge status: Home / Self Care | Payer: Self-pay | Attending: Emergency Medicine | Admitting: Emergency Medicine

## 2015-02-23 ENCOUNTER — Encounter (HOSPITAL_COMMUNITY): Payer: Self-pay

## 2015-02-23 DIAGNOSIS — Z7289 Other problems related to lifestyle: Secondary | ICD-10-CM | POA: Insufficient documentation

## 2015-02-23 DIAGNOSIS — M5430 Sciatica, unspecified side: Secondary | ICD-10-CM | POA: Insufficient documentation

## 2015-02-23 DIAGNOSIS — M62830 Muscle spasm of back: Secondary | ICD-10-CM | POA: Insufficient documentation

## 2015-02-23 DIAGNOSIS — R569 Unspecified convulsions: Secondary | ICD-10-CM | POA: Insufficient documentation

## 2015-02-23 DIAGNOSIS — G8929 Other chronic pain: Secondary | ICD-10-CM | POA: Insufficient documentation

## 2015-02-23 DIAGNOSIS — M545 Low back pain: Secondary | ICD-10-CM | POA: Insufficient documentation

## 2015-02-23 DIAGNOSIS — Z8659 Personal history of other mental and behavioral disorders: Secondary | ICD-10-CM | POA: Insufficient documentation

## 2015-02-23 DIAGNOSIS — Z765 Malingerer [conscious simulation]: Secondary | ICD-10-CM

## 2015-02-23 DIAGNOSIS — M549 Dorsalgia, unspecified: Secondary | ICD-10-CM

## 2015-02-23 DIAGNOSIS — Z720 Tobacco use: Secondary | ICD-10-CM | POA: Insufficient documentation

## 2015-02-23 MED ORDER — KETOROLAC TROMETHAMINE 10 MG PO TABS
10.0000 mg | ORAL_TABLET | Freq: Once | ORAL | Status: AC
  Administered 2015-02-23: 10 mg via ORAL
  Filled 2015-02-23: qty 1

## 2015-02-23 MED ORDER — DIAZEPAM 5 MG PO TABS
10.0000 mg | ORAL_TABLET | Freq: Once | ORAL | Status: AC
  Administered 2015-02-23: 10 mg via ORAL
  Filled 2015-02-23: qty 2

## 2015-02-23 MED ORDER — CYCLOBENZAPRINE HCL 10 MG PO TABS: 10.0000 mg | ORAL_TABLET | Freq: Two times a day (BID) | ORAL | Status: DC | PRN

## 2015-02-23 MED ORDER — DICLOFENAC SODIUM 75 MG PO TBEC: 75.0000 mg | DELAYED_RELEASE_TABLET | Freq: Two times a day (BID) | ORAL | Status: DC

## 2015-02-23 NOTE — ED Notes (Signed)
Pt reports chronic pain, states he has had this pain for a long time off and on, pain in lower back that radiates down both legs.

## 2015-02-23 NOTE — ED Notes (Signed)
Talked to patient about chronic pain management. Advised him that the ER was not the place for Chronic pain management and the physicians here had already advised him to follow up with a pain clinic. Patient states that he does not have the resources to get around. Advised him to call the hospital on Monday to see if there were any community resources to help him. Also advised him that there was a pain clinic in [place].

## 2015-02-23 NOTE — ED Provider Notes (Signed)
CSN: [id number]     Arrival date & time 02/23/15  0047 History   First MD Initiated Contact with Patient 02/23/15 0056     Chief Complaint  Patient presents with  . Back Pain     (Consider location/radiation/quality/duration/timing/severity/associated sxs/prior Treatment) HPI Comments: Patient is a 39 year old male who presents to the emergency department with a complaint of back pain.  The patient states he has chronic back problems. He states he's had back problems as far (childhood. He has had accidents and other issues that have aggravated this. The patient further states he has been on different pain management programs, and seen by different neurology specialist. He reports that he had a "dirty drug screen" and can anticipate in the clinics that he had been in. He states that the pain is getting worse and worse, to the point it interferes at times with his activities of daily living. He states that the traditional medications don't help him. He needs narcotic medications. He states that when he has narcotic medications he can think, he can function. He presents to the emergency department to seek medication to help with his problem, and also to seek assistance and resources to get around to the clinics that he needs to get to. He has not had any loss of bowel or bladder function. He's not had repeated falls. He has not had loss of function of his lower extremities.  The history is provided by the patient.    Past Medical History  Diagnosis Date  . Chronic headaches   . Chronic back pain   . Polysubstance abuse   . Recurrent shoulder dislocation   . Sciatica   . Fibromyalgia   . Seizures ([place])     states epilepsy was dx but they stopped his meds for seizures and he stopped his meds and the seizures stopped. last seizure was a year ago.   Past Surgical History  Procedure Laterality Date  . Hand surgery Right     broken hand  . Wrist surgery Right     fractured hand and wrist.   . Colonoscopy with propofol N/A 08/10/2014    Procedure: COLONOSCOPY WITH PROPOFOL (Incomplete exam-to Desecending Colon, Cecum seen from a distance);  Surgeon: Rogene Houston, MD;  Location: [facility];  Service: Endoscopy;  Laterality: N/A;  . Polypectomy  08/10/2014    Procedure: POLYPECTOMY;  Surgeon: Rogene Houston, MD;  Location: [facility];  Service: Endoscopy;;   Family History  Problem Relation Age of Onset  . Cancer Mother   . Cancer Other   . Heart failure Other    Social History  Substance Use Topics  . Smoking status: Current Every Day Smoker -- 1.00 packs/day for 27 years    Types: Cigarettes  . Smokeless tobacco: Never Used  . Alcohol Use: No    Review of Systems  Musculoskeletal: Positive for myalgias and back pain.  Neurological: Positive for seizures.  All other systems reviewed and are negative.     Allergies  Nickel  Home Medications   Prior to Admission medications   Medication Sig Start Date End Date Taking? Authorizing Provider  acetaminophen (TYLENOL) 500 MG tablet Take 1,000 mg by mouth every 6 (six) hours as needed for headache.   Yes Historical Provider, MD  cyclobenzaprine (FLEXERIL) 10 MG tablet Take 1 tablet (10 mg total) by mouth 2 (two) times daily as needed for muscle spasms. 02/23/15   Lily Kocher, PA-C  diclofenac (VOLTAREN) 75 MG EC tablet Take 1  tablet (75 mg total) by mouth 2 (two) times daily. 02/23/15   Lily Kocher, PA-C  naproxen (NAPROSYN) 500 MG tablet Take 1 po BID with food prn pain 12/18/14   Rolland Porter, MD   BP 115/73 mmHg  Pulse 98  Temp(Src) 97.7 F (36.5 C) (Oral)  Resp 20  Ht 5\' 9"  (1.753 m)  Wt 180 lb (81.647 kg)  BMI 26.57 kg/m2  SpO2 99% Physical Exam  Constitutional: He is oriented to person, place, and time. He appears well-developed and well-nourished.  Non-toxic appearance.  HENT:  Head: Normocephalic.  Right Ear: Tympanic membrane and external ear normal.  Left Ear: Tympanic membrane and external ear normal.   Eyes: EOM and lids are normal. Pupils are equal, round, and reactive to light.  Neck: Normal range of motion. Neck supple. Carotid bruit is not present.  Cardiovascular: Normal rate, regular rhythm, normal heart sounds, intact distal pulses and normal pulses.   Pulmonary/Chest: Breath sounds normal. No respiratory distress.  Abdominal: Soft. Bowel sounds are normal. There is no tenderness. There is no guarding.  Musculoskeletal: Normal range of motion.       Lumbar back: He exhibits tenderness, pain and spasm.  Lymphadenopathy:       Head (right side): No submandibular adenopathy present.       Head (left side): No submandibular adenopathy present.    He has no cervical adenopathy.  Neurological: He is alert and oriented to person, place, and time. He has normal strength. No cranial nerve deficit or sensory deficit.  Gait is steady. No gross neurologic deficits appreciated on examination at this time.  Skin: Skin is warm and dry.  Psychiatric: He has a normal mood and affect. His speech is normal.  Patient has some concern about his condition, and has some depression, but is not suicidal or homicidal.  Nursing note and vitals reviewed.   ED Course  Procedures (including critical care time) Labs Review Labs Reviewed - No data to display  Imaging Review No results found. I have personally reviewed and evaluated these images and lab results as part of my medical decision-making.   EKG Interpretation None      MDM  Vital signs reviewed. The patient is ambulatory without problem. No gross neurologic deficits appreciated. The patient seems somewhat depressed about his condition, but denies suicidal homicidal ideation.  I discussed with the patient the need to seek assistance with his pain other than in the emergency department. I discussed with him in the emergency department is happy to assist with the emergent situations, but is not set up for chronic pain. I further discussed with  the patient to be in touch with the social worker at the hospital to assistant would resources that may be able to help him get to the clinics that he needs to get to. I also gave the patient resources to the adult medicine  tri[facility] clinic, and discussed  clinic in the [place] area may be helpful to him.  Patient was not satisfied with the medications given to him in the emergency department and requests narcotic prescriptions. I discussed with the patient had he would be given muscle relaxer and anti-inflammatory pain medication.    Final diagnoses:  Chronic back pain  Drug-seeking behavior    *I have reviewed nursing notes, vital signs, and all appropriate lab and imaging results for this patient.[address] PA-C [id number]  [name], MD [id number]

## 2015-02-23 NOTE — Discharge Instructions (Signed)
Please call the main number for the Hospital, and speak to one of the social workers for assistance with the resources. Warm tub soaks to your back maybe helpful. Flexeril may cause drowsiness, please use this medication with caution for spasm pain. Chronic Back Pain  When back pain lasts longer than 3 months, it is called chronic back pain.People with chronic back pain often go through certain periods that are more intense (flare-ups).  CAUSES Chronic back pain can be caused by wear and tear (degeneration) on different structures in your back. These structures include:  The bones of your spine (vertebrae) and the joints surrounding your spinal cord and nerve roots (facets).  The strong, fibrous tissues that connect your vertebrae (ligaments). Degeneration of these structures may result in pressure on your nerves. This can lead to constant pain. HOME CARE INSTRUCTIONS  Avoid bending, heavy lifting, prolonged sitting, and activities which make the problem worse.  Take brief periods of rest throughout the day to reduce your pain. Lying down or standing usually is better than sitting while you are resting.  Take over-the-counter or prescription medicines only as directed by your caregiver. SEEK IMMEDIATE MEDICAL CARE IF:   You have weakness or numbness in one of your legs or feet.  You have trouble controlling your bladder or bowels.  You have nausea, vomiting, abdominal pain, shortness of breath, or fainting.   This information is not intended to replace advice given to you by your health care provider. Make sure you discuss any questions you have with your health care provider.   Document Released: 05/07/2004 Document Revised: 06/22/2011 Document Reviewed: 09/17/2014 Elsevier Interactive Patient Education [employer].

## 2015-03-14 ENCOUNTER — Emergency Department (HOSPITAL_COMMUNITY)
Admission: EM | Admit: 2015-03-14 | Discharge: 2015-03-14 | Disposition: A | Discharge status: Home / Self Care | Payer: Self-pay | Attending: Emergency Medicine | Admitting: Emergency Medicine

## 2015-03-14 ENCOUNTER — Emergency Department (HOSPITAL_COMMUNITY): Payer: Self-pay

## 2015-03-14 ENCOUNTER — Encounter (HOSPITAL_COMMUNITY): Payer: Self-pay | Admitting: Cardiology

## 2015-03-14 DIAGNOSIS — Y9289 Other specified places as the place of occurrence of the external cause: Secondary | ICD-10-CM | POA: Insufficient documentation

## 2015-03-14 DIAGNOSIS — F17210 Nicotine dependence, cigarettes, uncomplicated: Secondary | ICD-10-CM | POA: Insufficient documentation

## 2015-03-14 DIAGNOSIS — S61012A Laceration without foreign body of left thumb without damage to nail, initial encounter: Secondary | ICD-10-CM | POA: Insufficient documentation

## 2015-03-14 DIAGNOSIS — Z791 Long term (current) use of non-steroidal anti-inflammatories (NSAID): Secondary | ICD-10-CM | POA: Insufficient documentation

## 2015-03-14 DIAGNOSIS — Y9389 Activity, other specified: Secondary | ICD-10-CM | POA: Insufficient documentation

## 2015-03-14 DIAGNOSIS — Z9889 Other specified postprocedural states: Secondary | ICD-10-CM | POA: Insufficient documentation

## 2015-03-14 DIAGNOSIS — G8929 Other chronic pain: Secondary | ICD-10-CM | POA: Insufficient documentation

## 2015-03-14 DIAGNOSIS — W268XXA Contact with other sharp object(s), not elsewhere classified, initial encounter: Secondary | ICD-10-CM | POA: Insufficient documentation

## 2015-03-14 DIAGNOSIS — Z87828 Personal history of other (healed) physical injury and trauma: Secondary | ICD-10-CM | POA: Insufficient documentation

## 2015-03-14 DIAGNOSIS — Y998 Other external cause status: Secondary | ICD-10-CM | POA: Insufficient documentation

## 2015-03-14 DIAGNOSIS — M797 Fibromyalgia: Secondary | ICD-10-CM | POA: Insufficient documentation

## 2015-03-14 MED ORDER — HYDROCODONE-ACETAMINOPHEN 5-325 MG PO TABS
1.0000 | ORAL_TABLET | Freq: Once | ORAL | Status: AC
  Administered 2015-03-14: 1 via ORAL
  Filled 2015-03-14: qty 1

## 2015-03-14 MED ORDER — LIDOCAINE HCL (PF) 1 % IJ SOLN
5.0000 mL | Freq: Once | INTRAMUSCULAR | Status: AC
  Administered 2015-03-14: 5 mL via INTRADERMAL
  Filled 2015-03-14: qty 5

## 2015-03-14 NOTE — Discharge Instructions (Signed)
Sutured Wound Care Sutures are stitches that can be used to close wounds. Taking care of your wound properly can help prevent pain and infection. It can also help your wound to heal more quickly. HOW TO CARE FOR YOUR SUTURED WOUND Wound Care  Keep the wound clean and dry.  If you were given a bandage (dressing), change it at least one time per day or as told by your doctor. You should also change it if it gets wet or dirty.  Keep the wound completely dry for the first 24 hours or as told by your doctor. After that time, you may shower or bathe. However, make sure that the wound is not soaked in water until the sutures have been removed.  Clean the wound one time each day or as told by your doctor.  Wash the wound with soap and water.  Rinse the wound with water to remove all soap.  Pat the wound dry with a clean towel. Do not rub the wound.  After cleaning the wound, put a thin layer of antibiotic ointment on it as told your doctor. This ointment:  Helps to prevent infection.  Keeps the bandage from sticking to the wound.  Have the sutures removed as told by your doctor. General Instructions  Take or apply medicines only as told by your doctor.  To help prevent scarring, make sure to cover your wound with sunscreen whenever you are outside after the sutures are removed and the wound is healed. Make sure to wear a sunscreen of at least 30 SPF.  If you were prescribed an antibiotic medicine or ointment, finish all of it even if you start to feel better.  Do not scratch or pick at the wound.  Keep all follow-up visits as told by your doctor. This is important.  Check your wound every day for signs of infection. Watch for:  Redness, swelling, or pain.  Fluid, blood, or pus.  Raise (elevate) the injured area above the level of your heart while you are sitting or lying down, if possible.  Avoid stretching your wound.  Drink enough fluids to keep your pee (urine) clear or  pale yellow. GET HELP IF:  You were given a tetanus shot and you have any of these where the needle went in:  Swelling.  Very bad pain.  Redness.  Bleeding.  You have a fever.  A wound that was closed breaks open.  You notice a bad smell coming from the wound.  You notice something coming out of the wound, such as wood or glass.  Medicine does not help your pain.  You have any of these at the site of the wound.  More redness.  More swelling.  More pain.  You have any of these coming from the wound.  Fluid.  Blood.  Pus.  You notice a change in the color of your skin near the wound.  You need to change the bandage often due to fluid, blood, or pus coming from the wound.  You have a new rash.  You have numbness around the wound. GET HELP RIGHT AWAY IF:  You have very bad swelling around the wound.  Your pain suddenly gets worse and is very bad.  You have painful lumps near the wound or on skin that is anywhere on your body.  You have a red streak going away from the wound.  The wound is on your hand or foot and you cannot move a finger or toe like normal.  The wound is on your hand or foot and you notice that your fingers or toes look pale or bluish.   This information is not intended to replace advice given to you by your health care provider. Make sure you discuss any questions you have with your health care provider.   Document Released: 09/16/2007 Document Revised: 08/14/2014 Document Reviewed: 11/09/2012 Elsevier Interactive Patient Education [employer].

## 2015-03-14 NOTE — ED Provider Notes (Signed)
CSN: [id number]     Arrival date & time 03/14/15  [id number] History   First MD Initiated Contact with Patient 03/14/15 1845     Chief Complaint  Patient presents with  . Laceration     (Consider location/radiation/quality/duration/timing/severity/associated sxs/prior Treatment) HPI    Nathan Mckay is a 39 y.o. male who presents to the Emergency Department complaining of laceration to his left thumb that occurred several hours prior to ED arrival.  He states the incident occurred while he was carrying a piece of metal which slid down his hand causing the wound.  He has not tried any therapies. Complains of sharp pain to his hand.   Last Td is up to date per patient.  He denies numbness, weakness of the hand, swelling or continued bleeding.     Past Medical History  Diagnosis Date  . Chronic headaches   . Chronic back pain   . Polysubstance abuse   . Recurrent shoulder dislocation   . Sciatica   . Fibromyalgia   . Seizures ([place])     states epilepsy was dx but they stopped his meds for seizures and he stopped his meds and the seizures stopped. last seizure was a year ago.   Past Surgical History  Procedure Laterality Date  . Hand surgery Right     broken hand  . Wrist surgery Right     fractured hand and wrist.  . Colonoscopy with propofol N/A 08/10/2014    Procedure: COLONOSCOPY WITH PROPOFOL (Incomplete exam-to Desecending Colon, Cecum seen from a distance);  Surgeon: Rogene Houston, MD;  Location: [facility];  Service: Endoscopy;  Laterality: N/A;  . Polypectomy  08/10/2014    Procedure: POLYPECTOMY;  Surgeon: Rogene Houston, MD;  Location: [facility];  Service: Endoscopy;;   Family History  Problem Relation Age of Onset  . Cancer Mother   . Cancer Other   . Heart failure Other    Social History  Substance Use Topics  . Smoking status: Current Every Day Smoker -- 1.00 packs/day for 27 years    Types: Cigarettes  . Smokeless tobacco: Never Used  . Alcohol Use: No    Review  of Systems  Constitutional: Negative for fever and chills.  Musculoskeletal: Negative for back pain, joint swelling and arthralgias.  Skin: Positive for wound.       Laceration   Neurological: Negative for weakness and numbness.  Hematological: Does not bruise/bleed easily.  All other systems reviewed and are negative.     Allergies  Nickel  Home Medications   Prior to Admission medications   Medication Sig Start Date End Date Taking? Authorizing Provider  acetaminophen (TYLENOL) 500 MG tablet Take 1,000 mg by mouth every 6 (six) hours as needed for headache.    Historical Provider, MD  cyclobenzaprine (FLEXERIL) 10 MG tablet Take 1 tablet (10 mg total) by mouth 2 (two) times daily as needed for muscle spasms. 02/23/15   Lily Kocher, PA-C  diclofenac (VOLTAREN) 75 MG EC tablet Take 1 tablet (75 mg total) by mouth 2 (two) times daily. 02/23/15   Lily Kocher, PA-C  naproxen (NAPROSYN) 500 MG tablet Take 1 po BID with food prn pain 12/18/14   Rolland Porter, MD   BP 110/66 mmHg  Pulse 76  Temp(Src) 97.6 F (36.4 C) (Oral)  Resp 18  Ht 5\' 11"  (1.803 m)  Wt 86.183 kg  BMI 26.51 kg/m2  SpO2 97% Physical Exam  Constitutional: He is oriented to person, place, and time.  He appears well-developed and well-nourished. No distress.  HENT:  Head: Normocephalic and atraumatic.  Cardiovascular: Normal rate, regular rhythm, normal heart sounds and intact distal pulses.   No murmur heard. Pulmonary/Chest: Effort normal and breath sounds normal. No respiratory distress.  Musculoskeletal: He exhibits no edema or tenderness.  Full ROM of the left thumb.  Distal sensation intact.    Neurological: He is alert and oriented to person, place, and time. He exhibits normal muscle tone. Coordination normal.  Skin: Skin is warm. Laceration noted.  Laceration to the thenar eminence of the left thumb.  Bleeding controlled.    Nursing note and vitals reviewed.   ED Course  Procedures (including critical  care time) Labs Review Labs Reviewed - No data to display  Imaging Review Dg Hand Complete Left  03/14/2015  CLINICAL DATA:  Pt cut Lt hand on a metal sign while working today. Lac to anterior surface of proximal Lt thumb. Pain throughout Lt metacarpals EXAM: LEFT HAND - COMPLETE 3+ VIEW COMPARISON:  None. FINDINGS: There is no evidence of fracture or dislocation. There is no evidence of arthropathy or other focal bone abnormality. Soft tissues are unremarkable. IMPRESSION: Negative. Electronically Signed   By: Nolon Nations M.D.   On: 03/14/2015 18:56   I have personally reviewed and evaluated these images and lab results as part of my medical decision-making.  LACERATION REPAIR Performed by: Bambi Fehnel L. Authorized by: Hale Bogus Consent: Verbal consent obtained. Risks and benefits: risks, benefits and alternatives were discussed Consent given by: patient Patient identity confirmed: provided demographic data Prepped and Draped in normal sterile fashion Wound explored  Laceration Location: left thumb  Laceration Length: 1 cm  No Foreign Bodies seen or palpated  Anesthesia: local infiltration  Local anesthetic: lidocaine 1 % w/o epinephrine  Anesthetic total: 2   ml  Irrigation method: syringe Amount of cleaning: standard  Skin closure: 4-0 ethilon  Number of sutures: 2  Technique: simple interrupted  Patient tolerance: Patient tolerated the procedure well with no immediate complications.   MDM   Final diagnoses:  Laceration of thumb, left, initial encounter    Td up to date.  NV and NS intact. Wound edges well approximated.  Pt requesting narcotic pain medication.  I have advised him that narcotics are not indicated for a laceration.  Offered ibuprofen or ultram and pt refused.  Lac appears minor.  Bleeding controlled prior to closure    Kem Parkinson, PA-C 03/16/15 [place], MD 03/20/15 1554

## 2015-03-14 NOTE — ED Notes (Addendum)
Laceration noted to left hand below thumb on palm of hand with bleeding controlled. Upon entering pt's room he was on the computer in the room watching videos online and quickly exited the screen and sat on the bed.

## 2015-03-14 NOTE — ED Notes (Signed)
Pt states understanding of care given and follow up instructions.  Ambulated from ED  

## 2015-03-14 NOTE — ED Notes (Signed)
Laceration to left hand on some metal .

## 2015-03-21 ENCOUNTER — Emergency Department (HOSPITAL_COMMUNITY)
Admission: EM | Admit: 2015-03-21 | Discharge: 2015-03-21 | Disposition: A | Discharge status: Home / Self Care | Payer: Self-pay | Attending: Emergency Medicine | Admitting: Emergency Medicine

## 2015-03-21 ENCOUNTER — Encounter (HOSPITAL_COMMUNITY): Payer: Self-pay | Admitting: *Deleted

## 2015-03-21 DIAGNOSIS — Z8659 Personal history of other mental and behavioral disorders: Secondary | ICD-10-CM | POA: Insufficient documentation

## 2015-03-21 DIAGNOSIS — Z791 Long term (current) use of non-steroidal anti-inflammatories (NSAID): Secondary | ICD-10-CM | POA: Insufficient documentation

## 2015-03-21 DIAGNOSIS — M545 Low back pain, unspecified: Secondary | ICD-10-CM

## 2015-03-21 DIAGNOSIS — G8929 Other chronic pain: Secondary | ICD-10-CM | POA: Insufficient documentation

## 2015-03-21 DIAGNOSIS — F17210 Nicotine dependence, cigarettes, uncomplicated: Secondary | ICD-10-CM | POA: Insufficient documentation

## 2015-03-21 DIAGNOSIS — M797 Fibromyalgia: Secondary | ICD-10-CM | POA: Insufficient documentation

## 2015-03-21 MED ORDER — CYCLOBENZAPRINE HCL 10 MG PO TABS
10.0000 mg | ORAL_TABLET | Freq: Once | ORAL | Status: AC
  Administered 2015-03-21: 10 mg via ORAL
  Filled 2015-03-21: qty 1

## 2015-03-21 MED ORDER — KETOROLAC TROMETHAMINE 60 MG/2ML IM SOLN
60.0000 mg | Freq: Once | INTRAMUSCULAR | Status: AC
  Administered 2015-03-21: 60 mg via INTRAMUSCULAR
  Filled 2015-03-21: qty 2

## 2015-03-21 MED ORDER — CYCLOBENZAPRINE HCL 10 MG PO TABS: 10.0000 mg | ORAL_TABLET | Freq: Two times a day (BID) | ORAL | Status: DC | PRN

## 2015-03-21 NOTE — ED Notes (Signed)
MD at bedside. 
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
## 2015-03-21 NOTE — ED Notes (Signed)
Pt states he feels that he has slipped a disc in his back. Pt is very anxious stating that he needs help. States "Pain for a decade" but increased pain past week. States pain form his lower back down to his feet. Pt is able to ambulate without difficulty. Denies problems with urination or BMs.

## 2015-03-21 NOTE — ED Notes (Signed)
Pt with call light on. Asking for pain meds for pain in back and legs. States he needs relief now. Explained that NP would be in soon to eval. Np notififed

## 2015-03-21 NOTE — Discharge Instructions (Signed)
Follow up with [facility] in [place]

## 2015-05-21 ENCOUNTER — Emergency Department (HOSPITAL_COMMUNITY): Payer: Self-pay

## 2015-05-21 ENCOUNTER — Encounter (HOSPITAL_COMMUNITY): Payer: Self-pay | Admitting: Emergency Medicine

## 2015-05-21 ENCOUNTER — Emergency Department (HOSPITAL_COMMUNITY)
Admission: EM | Admit: 2015-05-21 | Discharge: 2015-05-22 | Discharge status: Against Medical Advice | Payer: Self-pay | Attending: Emergency Medicine | Admitting: Emergency Medicine

## 2015-05-21 DIAGNOSIS — M25561 Pain in right knee: Secondary | ICD-10-CM | POA: Insufficient documentation

## 2015-05-21 DIAGNOSIS — K921 Melena: Secondary | ICD-10-CM | POA: Insufficient documentation

## 2015-05-21 DIAGNOSIS — M797 Fibromyalgia: Secondary | ICD-10-CM | POA: Insufficient documentation

## 2015-05-21 DIAGNOSIS — Z791 Long term (current) use of non-steroidal anti-inflammatories (NSAID): Secondary | ICD-10-CM | POA: Insufficient documentation

## 2015-05-21 DIAGNOSIS — Z8659 Personal history of other mental and behavioral disorders: Secondary | ICD-10-CM | POA: Insufficient documentation

## 2015-05-21 DIAGNOSIS — M25562 Pain in left knee: Secondary | ICD-10-CM | POA: Insufficient documentation

## 2015-05-21 DIAGNOSIS — M549 Dorsalgia, unspecified: Secondary | ICD-10-CM | POA: Insufficient documentation

## 2015-05-21 DIAGNOSIS — G8929 Other chronic pain: Secondary | ICD-10-CM | POA: Insufficient documentation

## 2015-05-21 DIAGNOSIS — F17210 Nicotine dependence, cigarettes, uncomplicated: Secondary | ICD-10-CM | POA: Insufficient documentation

## 2015-05-21 DIAGNOSIS — Z87828 Personal history of other (healed) physical injury and trauma: Secondary | ICD-10-CM | POA: Insufficient documentation

## 2015-05-21 DIAGNOSIS — M79641 Pain in right hand: Secondary | ICD-10-CM | POA: Insufficient documentation

## 2015-05-21 DIAGNOSIS — R319 Hematuria, unspecified: Secondary | ICD-10-CM | POA: Insufficient documentation

## 2015-05-21 LAB — POC OCCULT BLOOD, ED: Fecal Occult Bld: NEGATIVE

## 2015-05-21 NOTE — ED Notes (Signed)
Pt states he started having blood in urine and stools since yesterday

## 2015-05-21 NOTE — ED Provider Notes (Signed)
By signing my name below, I, Helane Gunther, attest that this documentation has been prepared under the direction and in the presence of [employer], DO. Electronically Signed: Helane Gunther, ED Scribe. 05/21/2015. 11:49 PM.  TIME SEEN: 11:32 PM  CHIEF COMPLAINT: Hematuria and Bloody Stool, back pain, right hand pain, right knee pain  HPI: Nathan Mckay is a 40 y.o. male with a PMHx of chronic back pain, sciatica, fibromyalgia and chronic headaches who presents to the Emergency Department complaining of intermittent hematuria for over 2 years and bloody stools onset 2 years ago, worsening significantly as of yesterday. He also reports that "something feels different" when urinating. Denies dysuria.  He reports a PMHx of an STI years ago but has no concern for one currently. Pt denies penile discharge or testicular pain. No testicular swelling. Reports he has had a previous colonoscopy and had a polyp removed. Denies melena.  He also c/o a painful right hand fracture that occurred several months ago. He is right-hand dominant. Reports that he injured his hand again a week and a half ago. Reports swelling to the right hand. Also complaining of left knee pain onset several years ago, and back pain onset in 1997.  Denies any new injury to the knee. No new injury to the back. No numbness, tingling or focal weakness. No bowel or bladder incontinence. Pt states he has run out of pain medication and has yet to get a new prescription. He reports that he is waiting to get into a pain clinic.  ROS: See HPI Constitutional: no fever  Eyes: no drainage  ENT: no runny nose   Cardiovascular:  no chest pain  Resp: no SOB  GI: no vomiting GU: no dysuria Integumentary: no rash  Allergy: no hives  Musculoskeletal: no leg swelling  Neurological: no slurred speech ROS otherwise negative  PAST MEDICAL HISTORY/PAST SURGICAL HISTORY:  Past Medical History  Diagnosis Date  . Chronic headaches   . Chronic  back pain   . Polysubstance abuse   . Recurrent shoulder dislocation   . Sciatica   . Fibromyalgia   . Seizures ([place])     states epilepsy was dx but they stopped his meds for seizures and he stopped his meds and the seizures stopped. last seizure was a year ago.    MEDICATIONS:  Prior to Admission medications   Medication Sig Start Date End Date Taking? Authorizing Provider  acetaminophen (TYLENOL) 500 MG tablet Take 1,000 mg by mouth every 6 (six) hours as needed for headache.    Historical Provider, MD  cyclobenzaprine (FLEXERIL) 10 MG tablet Take 1 tablet (10 mg total) by mouth 2 (two) times daily as needed for muscle spasms. 03/21/15   Milton Ferguson, MD  diclofenac (VOLTAREN) 75 MG EC tablet Take 1 tablet (75 mg total) by mouth 2 (two) times daily. 02/23/15   Lily Kocher, PA-C  naproxen (NAPROSYN) 500 MG tablet Take 1 po BID with food prn pain Patient not taking: Reported on 03/21/2015 12/18/14   Rolland Porter, MD    ALLERGIES:  Allergies  Allergen Reactions  . Nickel Rash    SOCIAL HISTORY:  Social History  Substance Use Topics  . Smoking status: Current Every Day Smoker -- 1.00 packs/day for 27 years    Types: Cigarettes  . Smokeless tobacco: Never Used  . Alcohol Use: No    FAMILY HISTORY: Family History  Problem Relation Age of Onset  . Cancer Mother   . Cancer Other   .  Heart failure Other     EXAM: BP 120/69 mmHg  Pulse 94  Temp(Src) 98.4 F (36.9 C)  Resp 18  Ht 5\' 9"  (1.753 m)  Wt 180 lb (81.647 kg)  BMI 26.57 kg/m2  SpO2 96% CONSTITUTIONAL: Alert and oriented and responds appropriately to questions. Well-appearing; well-nourished, agitated and difficult to redirect, afebrile and nontoxic HEAD: Normocephalic EYES: Conjunctivae clear, PERRL ENT: normal nose; no rhinorrhea; moist mucous membranes; pharynx without lesions noted NECK: Supple, no meningismus, no LAD  CARD: RRR; S1 and S2 appreciated; no murmurs, no clicks, no rubs, no gallops RESP: Normal  chest excursion without splinting or tachypnea; breath sounds clear and equal bilaterally; no wheezes, no rhonchi, no rales, no hypoxia or respiratory distress, speaking full sentences ABD/GI: Normal bowel sounds; non-distended; soft, non-tender, no rebound, no guarding, no peritoneal signs GU:  Circumcised male, no blood or discharge at the urethral meatus, normal-appearing testicles without swelling or tenderness, no scrotal masses RECTAL: No gross blood, no melena, normal rectal tone, no hemorrhoids, no abscess or sign of cellulitis, guaiac negative BACK:  The back appears normal and is non-tender to palpation, there is no CVA tenderness, no midline spinal tenderness or step-off or deformity EXT: R hand with some swelling over the 4th and 5th metacarpal with no ecchymosis or deformity.  Patient has some tenderness over the tibial plateau of the right lower extremity without joint effusion. No ligamentous laxity. Compartments are all soft. 2+ radial and DP pulses bilaterally. Normal ROM in all joints; otherwise externally is are non-tender to palpation; no edema; normal capillary refill; no cyanosis, no calf tenderness or swelling. No erythema or warmth noted any his extremities.  No scissoring of the fingers when patient makes a fist with his right hand.    SKIN: Normal color for age and race; warm, no rash NEURO: Moves all extremities equally, sensation to light touch intact diffusely, cranial nerves II through XII intact, 5/5 strength in all extremities, normal gait PSYCH: Patient agitated and difficulty redirect. He denies SI, HI or hallucinations. He contracts for safety. Grooming and personal hygiene are appropriate.  MEDICAL DECISION MAKING: Patient here with complaints of multiple chronic conditions. Immediately upon me entering the room patient begins demanding that I give him IV Dilaudid. When I attempt to redirect the patient to go back to giving me his history Me to examine him he will  frequently interrupt me asking for Dilaudid. He complains of multiple conditions that have been present for many years. His exam today is completely benign other than some swelling over the fourth and fifth metacarpal of the right hand without bony deformity or ecchymosis. He is neurologically intact and afebrile. There is no rectal bleeding, melena on exam. GU exam is also normal. I have recommended x-rays of his right knee and right hand which he agrees to as well as a urinalysis. He repeatedly is demanding Dilaudid. He states he drove himself to the emergency department and have discussed with him that I am happy to treat his pain while he is here but I need to see that he has a sober driver that can take him home. He states he will call friend come to the emergency department. I have also explained to him that if we do not find anything acute that he will not be discharged with narcotic pain medication for chronic conditions.  ED PROGRESS: Patient reportedly told x-ray technician something that made them concerned about suicidal thoughts. I ordered labs, urine and a TTS consult.  Patient was then found walking down the hallway leaving the emergency department before any of his results were back. I was able to stop him and talk to him. He states that if I wasn't giving give him something for pain that he was going to leave. I explained to him again that we needed to have someone here that could prove that they were driving him home in order to give him narcotic pain medication. Have offered him Tylenol, ibuprofen, Toradol and other medications several times to help with his pain that are nonnarcotic in the meantime but he refuses these. He refuses to wait for any of his imaging, urine to come back. He states that he is not suicidal and never expressed suicidality. Denies a prior history of a suicide attempt. He is able to contract for safety and denies HI or hallucinations. This time do not feel he needs to stay  or be involuntarily committed. I feel he does have the capacity to make this decision for himself and does not appear intoxicated. He wants to leave the hospital [facility] without waiting for his workup to come back. He states he plans to go to another emergency department.     Patient's urine does show blood and many bacteria as well as calcium oxylate crystals. He was not having any flank pain on exam but complaining of central lower back pain. He was neurologically intact. Urine drug screen is positive for opiates, benzodiazepines, amphetamines and THC. X-ray of the right hand that show minimally displaced fracture at the base of the fifth metacarpal but patient has already left the emergency department and I am unable to splint him or give him follow-up. X-ray of the right knee is negative.  Pt left on his on volition AMA.      I personally performed the services described in this documentation, which was scribed in my presence. The recorded information has been reviewed and is accurate.    [place], DO 05/22/15 [phone]

## 2015-05-22 ENCOUNTER — Emergency Department (HOSPITAL_COMMUNITY)
Admission: EM | Admit: 2015-05-22 | Discharge: 2015-05-22 | Disposition: A | Discharge status: Home / Self Care | Payer: MEDICAID | Attending: Emergency Medicine | Admitting: Emergency Medicine

## 2015-05-22 ENCOUNTER — Encounter (HOSPITAL_COMMUNITY): Payer: Self-pay | Admitting: Emergency Medicine

## 2015-05-22 DIAGNOSIS — S62309D Unspecified fracture of unspecified metacarpal bone, subsequent encounter for fracture with routine healing: Secondary | ICD-10-CM

## 2015-05-22 DIAGNOSIS — G8929 Other chronic pain: Secondary | ICD-10-CM | POA: Insufficient documentation

## 2015-05-22 DIAGNOSIS — F17210 Nicotine dependence, cigarettes, uncomplicated: Secondary | ICD-10-CM | POA: Insufficient documentation

## 2015-05-22 DIAGNOSIS — W1839XD Other fall on same level, subsequent encounter: Secondary | ICD-10-CM | POA: Insufficient documentation

## 2015-05-22 DIAGNOSIS — R471 Dysarthria and anarthria: Secondary | ICD-10-CM | POA: Insufficient documentation

## 2015-05-22 DIAGNOSIS — S62316D Displaced fracture of base of fifth metacarpal bone, right hand, subsequent encounter for fracture with routine healing: Secondary | ICD-10-CM | POA: Insufficient documentation

## 2015-05-22 DIAGNOSIS — Z9889 Other specified postprocedural states: Secondary | ICD-10-CM | POA: Insufficient documentation

## 2015-05-22 LAB — RAPID URINE DRUG SCREEN, HOSP PERFORMED
AMPHETAMINES: POSITIVE — AB
Barbiturates: NOT DETECTED
Benzodiazepines: POSITIVE — AB
Cocaine: NOT DETECTED
Opiates: POSITIVE — AB
TETRAHYDROCANNABINOL: POSITIVE — AB

## 2015-05-22 LAB — URINALYSIS, ROUTINE W REFLEX MICROSCOPIC
BILIRUBIN URINE: NEGATIVE
GLUCOSE, UA: NEGATIVE mg/dL
Ketones, ur: NEGATIVE mg/dL
Leukocytes, UA: NEGATIVE
Nitrite: NEGATIVE
Protein, ur: NEGATIVE mg/dL
pH: 6 (ref 5.0–8.0)

## 2015-05-22 LAB — URINE MICROSCOPIC-ADD ON

## 2015-05-22 MED ORDER — OXYCODONE-ACETAMINOPHEN 5-325 MG PO TABS: 1.0000 | ORAL_TABLET | ORAL | Status: DC | PRN

## 2015-05-22 NOTE — ED Notes (Signed)
Pt requesting medication for pain. Reports pain to RT hand, RT knee, and lower back. Pt states he was seen in ED last night and was upset on how he was treated. This RN was informed that pt became extremely angry and rude with Dr. Leonides Schanz and staff when he was not prescribed the pain medication he wanted. MD [place] aware and security standing by. Upon assessment, pt seemed extremely agitated.

## 2015-05-22 NOTE — ED Notes (Signed)
Standing in hallway while MD speaking to pt. Pt denied saying he was going to hurt himself. EDP verified pt had no suicidal thoughts & he promised EDP that with him leaving he was not going to attempt to hurt himself.

## 2015-05-22 NOTE — ED Provider Notes (Signed)
CSN: [id number]     Arrival date & time 05/22/15  1557 History   First MD Initiated Contact with Patient 05/22/15 1650     Chief Complaint  Patient presents with  . Hand Pain     (Consider location/radiation/quality/duration/timing/severity/associated sxs/prior Treatment) HPI   Nathan Mckay is a 40 y.o. male returns for reevaluation of right hand pain. In the ED early this morning. Left AGAINST MEDICAL ADVICE after a comprehensive evaluation. He left before hearing the results of his right hand x-ray. He states that he injured his right hand, 3 days ago, when he fell on it, while working. The pain persisted, and has not changed. He has had swelling in the right hand, which is improving somewhat. States he previously injured this hand and required a surgical repair. He does not complain of anything else at this time. No other known modifying factors.   Past Medical History  Diagnosis Date  . Chronic headaches   . Chronic back pain   . Polysubstance abuse   . Recurrent shoulder dislocation   . Sciatica   . Fibromyalgia   . Seizures ([place])     states epilepsy was dx but they stopped his meds for seizures and he stopped his meds and the seizures stopped. last seizure was a year ago.   Past Surgical History  Procedure Laterality Date  . Hand surgery Right     broken hand  . Wrist surgery Right     fractured hand and wrist.  . Colonoscopy with propofol N/A 08/10/2014    Procedure: COLONOSCOPY WITH PROPOFOL (Incomplete exam-to Desecending Colon, Cecum seen from a distance);  Surgeon: Rogene Houston, MD;  Location: [facility];  Service: Endoscopy;  Laterality: N/A;  . Polypectomy  08/10/2014    Procedure: POLYPECTOMY;  Surgeon: Rogene Houston, MD;  Location: [facility];  Service: Endoscopy;;   Family History  Problem Relation Age of Onset  . Cancer Mother   . Cancer Other   . Heart failure Other    Social History  Substance Use Topics  . Smoking status: Current Every Day Smoker --  1.00 packs/day for 27 years    Types: Cigarettes  . Smokeless tobacco: Never Used  . Alcohol Use: No    Review of Systems  All other systems reviewed and are negative.     Allergies  Nickel  Home Medications   Prior to Admission medications   Medication Sig Start Date End Date Taking? Authorizing Provider  acetaminophen (TYLENOL) 500 MG tablet Take 1,000 mg by mouth every 6 (six) hours as needed for headache.    Historical Provider, MD  cyclobenzaprine (FLEXERIL) 10 MG tablet Take 1 tablet (10 mg total) by mouth 2 (two) times daily as needed for muscle spasms. Patient not taking: Reported on 05/22/2015 03/21/15   Milton Ferguson, MD  diclofenac (VOLTAREN) 75 MG EC tablet Take 1 tablet (75 mg total) by mouth 2 (two) times daily. Patient not taking: Reported on 05/22/2015 02/23/15   Lily Kocher, PA-C  naproxen (NAPROSYN) 500 MG tablet Take 1 po BID with food prn pain Patient not taking: Reported on 03/21/2015 12/18/14   Rolland Porter, MD  oxyCODONE-acetaminophen (PERCOCET) 5-325 MG tablet Take 1 tablet by mouth every 4 (four) hours as needed. 05/22/15   Daleen Bo, MD   BP 112/78 mmHg  Pulse 100  Temp(Src) 97.7 F (36.5 C) (Oral)  Resp 18  Ht 5\' 9"  (1.753 m)  Wt 200 lb (90.719 kg)  BMI 29.52 kg/m2  SpO2 98% Physical Exam  Constitutional: He is oriented to person, place, and time. He appears well-developed and well-nourished. No distress.  HENT:  Head: Normocephalic and atraumatic.  Right Ear: External ear normal.  Left Ear: External ear normal.  Eyes: Conjunctivae and EOM are normal. Pupils are equal, round, and reactive to light.  Neck: Normal range of motion and phonation normal. Neck supple.  Cardiovascular: Normal rate.   Pulmonary/Chest: Effort normal. He exhibits no bony tenderness.  Musculoskeletal:  Right hand, tender and swollen, ulnar aspect over the bases of fourth and fifth metacarpals. Intact distal function and circulation. No deformity of the right hand.   Neurological: He is alert and oriented to person, place, and time. No cranial nerve deficit or sensory deficit. He exhibits normal muscle tone. Coordination normal.  Mild dysarthria, nonspecific. No aphasia or nystagmus.  Skin: Skin is warm, dry and intact.  Psychiatric: He has a normal mood and affect. His behavior is normal. Judgment and thought content normal.  Nursing note and vitals reviewed.   ED Course  Procedures (including critical care time)  Medications - No data to display  Patient Vitals for the past 24 hrs:  BP Temp Temp src Pulse Resp SpO2 Height Weight  05/22/15 1609 112/78 mmHg 97.7 F (36.5 C) Oral 100 18 98 % 5\' 9"  (1.753 m) 200 lb (90.719 kg)     Ulnar gutter splint, right wrist and hand applied by nursing.   Labs Review Labs Reviewed - No data to display  Imaging Review Dg Knee Complete 4 Views Right  05/22/2015  CLINICAL DATA:  Acute onset of right knee pain.  Initial encounter. EXAM: RIGHT KNEE - COMPLETE 4+ VIEW COMPARISON:  Right tibia/fibula radiographs performed 07/13/2012 FINDINGS: There is no evidence of fracture or dislocation. An accessory ossicle is noted at the distal patellar tendon. The joint spaces are preserved. No significant degenerative change is seen; the patellofemoral joint is grossly unremarkable in appearance. No significant joint effusion is seen. The visualized soft tissues are normal in appearance. IMPRESSION: No evidence of fracture or dislocation. Electronically Signed   By: Garald Balding M.D.   On: 05/22/2015 00:40   Dg Hand Complete Right  05/22/2015  CLINICAL DATA:  40 year old male with trauma to the right hand. EXAM: RIGHT HAND - COMPLETE 3+ VIEW COMPARISON:  None. FINDINGS: There is a minimally displaced fracture of the base of the fifth metacarpal. No other fracture identified. There is soft tissue swelling over the ulnar aspect of the hand. IMPRESSION: Minimally displaced fracture of the base of the fifth metacarpal.  Electronically Signed   By: Anner Crete M.D.   On: 05/22/2015 00:30   I have personally reviewed and evaluated these images and lab results as part of my medical decision-making.   EKG Interpretation None      MDM   Final diagnoses:  Fracture metacarpal-open, with routine healing, subsequent encounter    Subacute fracture, likely 4 days old, right fifth metacarpal. There is a nonsurgical injury. Patient with drug-seeking behavior, but now with fracture which may require short-term treatment with narcotic location.  Nursing Notes Reviewed/ Care Coordinated Applicable Imaging Reviewed Interpretation of Laboratory Data incorporated into ED treatment  The patient appears reasonably screened and/or stabilized for discharge and I doubt any other medical condition or other [facility] requiring further screening, evaluation, or treatment in the ED at this time prior to discharge.  Plan: Home Medications- Percocet # 15; Home Treatments- rest; return here if the recommended treatment, does not  improve the symptoms; Recommended follow up- Ortho 3-5 days    Daleen Bo, MD 05/23/15 [phone]

## 2015-05-22 NOTE — ED Notes (Signed)
Patient complaining of right hand pain. States he was here yesterday and left because they only wanted to give him a shot of anti-inflammatory medication. Patient states "I need some pain medicine for my pain. My hand is broke. And they treat me like I'm a drug addict."

## 2015-05-22 NOTE — Discharge Instructions (Signed)
You have sustained a small fracture of the fifth metacarpal. This is unlikely to require surgery.  Wear the splint until you follow up with the orthopedic doctor. You should do that in 2 or 3 days.

## 2015-06-11 ENCOUNTER — Encounter (HOSPITAL_COMMUNITY): Payer: Self-pay

## 2015-06-11 ENCOUNTER — Emergency Department (HOSPITAL_COMMUNITY)
Admission: EM | Admit: 2015-06-11 | Discharge: 2015-06-12 | Discharge status: Against Medical Advice | Payer: MEDICAID | Attending: Emergency Medicine | Admitting: Emergency Medicine

## 2015-06-11 DIAGNOSIS — F17210 Nicotine dependence, cigarettes, uncomplicated: Secondary | ICD-10-CM | POA: Insufficient documentation

## 2015-06-11 DIAGNOSIS — R531 Weakness: Secondary | ICD-10-CM | POA: Insufficient documentation

## 2015-06-11 DIAGNOSIS — G8929 Other chronic pain: Secondary | ICD-10-CM

## 2015-06-11 DIAGNOSIS — M797 Fibromyalgia: Secondary | ICD-10-CM | POA: Insufficient documentation

## 2015-06-11 DIAGNOSIS — M549 Dorsalgia, unspecified: Secondary | ICD-10-CM | POA: Insufficient documentation

## 2015-06-11 DIAGNOSIS — Z87828 Personal history of other (healed) physical injury and trauma: Secondary | ICD-10-CM | POA: Insufficient documentation

## 2015-06-11 DIAGNOSIS — K625 Hemorrhage of anus and rectum: Secondary | ICD-10-CM | POA: Insufficient documentation

## 2015-06-11 LAB — TYPE AND SCREEN
ABO/RH(D): A POS
Antibody Screen: NEGATIVE

## 2015-06-11 LAB — COMPREHENSIVE METABOLIC PANEL
ALK PHOS: 74 U/L (ref 38–126)
ALT: 28 U/L (ref 17–63)
AST: 27 U/L (ref 15–41)
Albumin: 4.3 g/dL (ref 3.5–5.0)
Anion gap: 6 (ref 5–15)
BILIRUBIN TOTAL: 0.4 mg/dL (ref 0.3–1.2)
BUN: 12 mg/dL (ref 6–20)
CALCIUM: 9.1 mg/dL (ref 8.9–10.3)
CHLORIDE: 106 mmol/L (ref 101–111)
CO2: 29 mmol/L (ref 22–32)
CREATININE: 1.2 mg/dL (ref 0.61–1.24)
GFR calc Af Amer: >60 mL/min (ref >60)
Glucose, Bld: 86 mg/dL (ref 65–99)
Potassium: 4.3 mmol/L (ref 3.5–5.1)
Sodium: 141 mmol/L (ref 135–145)
TOTAL PROTEIN: 7.5 g/dL (ref 6.5–8.1)

## 2015-06-11 LAB — CBC
HCT: 44.4 % (ref 39.0–52.0)
Hemoglobin: 14.6 g/dL (ref 13.0–17.0)
MCH: 29.3 pg (ref 26.0–34.0)
MCHC: 32.9 g/dL (ref 30.0–36.0)
MCV: 89.2 fL (ref 78.0–100.0)
PLATELETS: 218 10*3/uL (ref 150–400)
RBC: 4.98 MIL/uL (ref 4.22–5.81)
RDW: 12.8 % (ref 11.5–15.5)
WBC: 10.3 10*3/uL (ref 4.0–10.5)

## 2015-06-11 LAB — POC OCCULT BLOOD, ED: Fecal Occult Bld: POSITIVE — AB

## 2015-06-11 NOTE — ED Notes (Signed)
Reports of "dark blood" in stool x1 week. States he has had this issue before but has flared back up. Patient states "I can over look the bleeding, but I need something for pain". Denies any other symptoms.

## 2015-06-11 NOTE — ED Notes (Signed)
Labs drawn

## 2015-06-11 NOTE — ED Provider Notes (Addendum)
CSN: [id number]     Arrival date & time 06/11/15  2048 History  By signing my name below, I, Helane Gunther, attest that this documentation has been prepared under the direction and in the presence of Merryl Hacker, MD. Electronically Signed: Helane Gunther, ED Scribe. 06/12/2015. 12:21 AM.     Chief Complaint  Patient presents with  . Rectal Bleeding   The history is provided by the patient. No language interpreter was used.   HPI Comments: Nathan Mckay is a 40 y.o. male smoker at 1 ppd with a PMHx of seizures, sciatica, chronic back pain, chronic headaches, fibromyalgia, and polysubstance abuse who presents to the Emergency Department complaining of rectal bleeding onset 1 week ago, worsening significantly as of this evening. He also reports chronic pain in his lower legs and back for over a decade. He currently rates his pain as an 8/10. He also reports occasional weakness. He also reports a PMHx of endoscopy. He notes he was being seen in a pain clinic and put on methadone, but when the clinic shut down, he was transferred to another clinic. He states that the night before he went to the new clinic in 2008 he was exposed to cocaine and was unable to get his medication. He states he gets prescription medications he used to take from "people I know." He denies any illegal drug use.   Regarding patient's dark stools and bloody stools, patient denies any abdominal pain. He has a history of the same and was found to have a small colon polyp and hemorrhoids on endoscopy. Denies any dizziness or syncope.  I reviewed the patient's chart in the [place] controlled substance database. He has no previous prescription but endorses taking other peoples medications. However, he was prescribed Percocet earlier this month on chart review but this is not showing up in the database. This was for an acute hand fracture.     Past Medical History  Diagnosis Date  . Chronic headaches   . Chronic back  pain   . Polysubstance abuse   . Recurrent shoulder dislocation   . Sciatica   . Fibromyalgia   . Seizures ([place])     states epilepsy was dx but they stopped his meds for seizures and he stopped his meds and the seizures stopped. last seizure was a year ago.   Past Surgical History  Procedure Laterality Date  . Hand surgery Right     broken hand  . Wrist surgery Right     fractured hand and wrist.  . Colonoscopy with propofol N/A 08/10/2014    Procedure: COLONOSCOPY WITH PROPOFOL (Incomplete exam-to Desecending Colon, Cecum seen from a distance);  Surgeon: Rogene Houston, MD;  Location: [facility];  Service: Endoscopy;  Laterality: N/A;  . Polypectomy  08/10/2014    Procedure: POLYPECTOMY;  Surgeon: Rogene Houston, MD;  Location: [facility];  Service: Endoscopy;;   Family History  Problem Relation Age of Onset  . Cancer Mother   . Cancer Other   . Heart failure Other    Social History  Substance Use Topics  . Smoking status: Current Every Day Smoker -- 1.00 packs/day for 27 years    Types: Cigarettes  . Smokeless tobacco: Never Used  . Alcohol Use: No    Review of Systems  Constitutional: Negative for fever.  Gastrointestinal: Positive for blood in stool. Negative for nausea, vomiting, abdominal pain and diarrhea.  Musculoskeletal: Positive for myalgias and back pain.  Neurological: Positive for weakness.  All other systems reviewed and are negative.   Allergies  Nickel  Home Medications   Prior to Admission medications   Medication Sig Start Date End Date Taking? Authorizing Provider  acetaminophen (TYLENOL) 500 MG tablet Take 1,000 mg by mouth every 6 (six) hours as needed for headache.    Historical Provider, MD  cyclobenzaprine (FLEXERIL) 10 MG tablet Take 1 tablet (10 mg total) by mouth 2 (two) times daily as needed for muscle spasms. Patient not taking: Reported on 05/22/2015 03/21/15   Milton Ferguson, MD  diclofenac (VOLTAREN) 75 MG EC tablet Take 1 tablet (75 mg total)  by mouth 2 (two) times daily. Patient not taking: Reported on 05/22/2015 02/23/15   Lily Kocher, PA-C  naproxen (NAPROSYN) 500 MG tablet Take 1 po BID with food prn pain Patient not taking: Reported on 03/21/2015 12/18/14   Rolland Porter, MD  oxyCODONE-acetaminophen (PERCOCET) 5-325 MG tablet Take 1 tablet by mouth every 4 (four) hours as needed. 05/22/15   Daleen Bo, MD   BP 98/59 mmHg  Pulse 86  Temp(Src) 97.9 F (36.6 C) (Oral)  Resp 16  Ht 5\' 9"  (1.753 m)  Wt 190 lb (86.183 kg)  BMI 28.05 kg/m2  SpO2 96% Physical Exam  Constitutional: He is oriented to person, place, and time. No distress.  Disheveled  HENT:  Head: Normocephalic and atraumatic.  Cardiovascular: Normal rate, regular rhythm and normal heart sounds.   No murmur heard. Pulmonary/Chest: Effort normal and breath sounds normal. No respiratory distress. He has no wheezes.  Abdominal: Soft. Bowel sounds are normal. There is no tenderness. There is no rebound and no guarding.  Genitourinary: Guaiac positive stool.  No gross blood, brown stool  Musculoskeletal: He exhibits no edema.  Lymphadenopathy:    He has no cervical adenopathy.  Neurological: He is alert and oriented to person, place, and time.  Skin: Skin is warm and dry.  Psychiatric: He has a normal mood and affect.  Nursing note and vitals reviewed.   ED Course  Procedures  DIAGNOSTIC STUDIES: Oxygen Saturation is 96% on RA, adequate by my interpretation.    COORDINATION OF CARE: 11:59 PM - Discussed plans to review pt's medial record. Will refer to a primary care physician for pain management. Pt advised of plan for treatment and pt agrees.  Labs Review Labs Reviewed  POC OCCULT BLOOD, ED - Abnormal; Notable for the following:    Fecal Occult Bld POSITIVE (*)    All other components within normal limits  COMPREHENSIVE METABOLIC PANEL  CBC  TYPE AND SCREEN    Imaging Review No results found. I have personally reviewed and evaluated these images  and lab results as part of my medical decision-making.   EKG Interpretation None      MDM   Final diagnoses:  Rectal bleeding  Chronic pain    Patient presents with multiple complaints. Regarding patient's rectal bleeding, he has no gross blood on exam. His abdomen is benign. Hemoglobin is normal. He does have a history of hemorrhoids and a small colon polyp. No evidence of ongoing bleeding. Blood pressures noted to be 98 systolic. Baseline [id number] systolic.  He is not orthostatic. Vital signs are otherwise reassuring. He will need to follow-up with Dr. Laural Golden.  Regarding patient's chronic pain, I discussed at length with the patient that in the emergency room, I'm unable to prescribe him narcotic pain medication for chronic pain. He needs to establish a primary physician for this purpose. Patient is adamant that I should "  be able to help him." I discussed with the patient that if there were any indication for acute pain management, I was happy to address this; however, given that all of his complaints are chronic, he would not receive narcotic pain medications. He states multiple times that the "system is messed up." Given the guaiac positive stools, I do not fill port all is an appropriate medication. Patient was offered Tylenol and Haldol. He will be given community resources.  After history, exam, and medical workup I feel the patient has been appropriately medically screened and is safe for discharge home. Pertinent diagnoses were discussed with the patient. Patient was given return precautions.  I personally performed the services described in this documentation, which was scribed in my presence. The recorded information has been reviewed and is accurate.   Merryl Hacker, MD 06/12/15 0111  Of note, patient left shortly after receiving Haldol without formal discharge instructions. I did discuss with him follow-up with gastroenterology. He left AGAINST MEDICAL ADVICE. Per nursing,  he was ambulatory without difficulty and did not appear to have any residual effects from the Haldol.  Merryl Hacker, MD 06/12/15 [phone]

## 2015-06-11 NOTE — ED Notes (Signed)
Rectum pain, dark red bloody, with some bright, formed stool.

## 2015-06-12 MED ORDER — HALOPERIDOL LACTATE 5 MG/ML IJ SOLN
5.0000 mg | Freq: Once | INTRAMUSCULAR | Status: AC
  Administered 2015-06-12: 5 mg via INTRAMUSCULAR
  Filled 2015-06-12: qty 1

## 2015-06-12 NOTE — Discharge Instructions (Signed)
You were seen today for multiple complaints. Regarding the warmer rectal bleeding, you do appear to have some microscopic blood on rectal exam. He has a history of colon polyp and hemorrhoids. Your CBC is reassuring. And there is no evidence of ongoing bleeding. Need to follow-up with gastroenterology as soon as possible. He may need a repeat colonoscopy. If you have recurrence or worsening of bleeding, you should be reevaluated more emergently.  Regarding your chronic pain. The emergency department is not an appropriate place to seek chronic pain medication. You will be given referral to the local primary care office to assess you for your pain.  You should not take other people's pain medication.   Gastrointestinal Bleeding Gastrointestinal (GI) bleeding means there is bleeding somewhere along the digestive tract, between the mouth and anus. CAUSES  There are many different problems that can cause GI bleeding. Possible causes include:  Esophagitis. This is inflammation, irritation, or swelling of the esophagus.  Hemorrhoids.These are veins that are full of blood (engorged) in the rectum. They cause pain, inflammation, and may bleed.  Anal fissures.These are areas of painful tearing which may bleed. They are often caused by passing hard stool.  Diverticulosis.These are pouches that form on the colon over time, with age, and may bleed significantly.  Diverticulitis.This is inflammation in areas with diverticulosis. It can cause pain, fever, and bloody stools, although bleeding is rare.  Polyps and cancer. Colon cancer often starts out as precancerous polyps.  Gastritis and ulcers.Bleeding from the upper gastrointestinal tract (near the stomach) may travel through the intestines and produce black, sometimes tarry, often bad smelling stools. In certain cases, if the bleeding is fast enough, the stools may not be black, but red. This condition may be life-threatening. SYMPTOMS   Vomiting  bright red blood or material that looks like coffee grounds.  Bloody, black, or tarry stools. DIAGNOSIS  Your caregiver may diagnose your condition by taking your history and performing a physical exam. More tests may be needed, including:  X-rays and other imaging tests.  Esophagogastroduodenoscopy (EGD). This test uses a flexible, lighted tube to look at your esophagus, stomach, and small intestine.  Colonoscopy. This test uses a flexible, lighted tube to look at your colon. TREATMENT  Treatment depends on the cause of your bleeding.   For bleeding from the esophagus, stomach, small intestine, or colon, the caregiver doing your EGD or colonoscopy may be able to stop the bleeding as part of the procedure.  Inflammation or infection of the colon can be treated with medicines.  Many rectal problems can be treated with creams, suppositories, or warm baths.  Surgery is sometimes needed.  Blood transfusions are sometimes needed if you have lost a lot of blood. If bleeding is slow, you may be allowed to go home. If there is a lot of bleeding, you will need to stay in the hospital for observation. HOME CARE INSTRUCTIONS   Take any medicines exactly as prescribed.  Keep your stools soft by eating foods that are high in fiber. These foods include whole grains, legumes, fruits, and vegetables. Prunes (1 to 3 a day) work well for many people.  Drink enough fluids to keep your urine clear or pale yellow. SEEK IMMEDIATE MEDICAL CARE IF:   Your bleeding increases.  You feel lightheaded, weak, or you faint.  You have severe cramps in your back or abdomen.  You pass large blood clots in your stool.  Your problems are getting worse. MAKE SURE YOU:  Understand these instructions.  Will watch your condition.  Will get help right away if you are not doing well or get worse.   This information is not intended to replace advice given to you by your health care provider. Make sure you  discuss any questions you have with your health care provider.   Document Released: 03/27/2000 Document Revised: 03/16/2012 Document Reviewed: 09/17/2014 Elsevier Interactive Patient Education 2016 [employer].   Chronic Pain Discharge Instructions  Emergency care providers appreciate that many patients coming to [place] are in severe pain and we wish to address their pain in the safest, most responsible manner.  It is important to recognize however, that the proper treatment of chronic pain differs from that of the pain of injuries and acute illnesses.  Our goal is to provide quality, safe, personalized care and we thank you for giving [place] the opportunity to serve you. The use of narcotics and related agents for chronic pain syndromes may lead to additional physical and psychological problems.  Nearly as many people die from prescription narcotics each year as die from car crashes.  Additionally, this risk is increased if such prescriptions are obtained from a variety of sources.  Therefore, only your primary care physician or a pain management specialist is able to safely treat such syndromes with narcotic medications long-term.    Documentation revealing such prescriptions have been sought from multiple sources may prohibit [place] from providing a refill or different narcotic medication.  Your name may be checked first through the [facility].  This database is a record of controlled substance medication prescriptions that the patient has received.  This has been established by [facility] in an effort to eliminate the dangerous, and often life threatening, practice of obtaining multiple prescriptions from different medical providers.   If you have a chronic pain syndrome (i.e. chronic headaches, recurrent back or neck pain, dental pain, abdominal or pelvis pain without a specific diagnosis, or neuropathic pain such as fibromyalgia) or recurrent visits for the same  condition without an acute diagnosis, you may be treated with non-narcotics and other non-addictive medicines.  Allergic reactions or negative side effects that may be reported by a patient to such medications will not typically lead to the use of a narcotic analgesic or other controlled substance as an alternative.   Patients managing chronic pain with a personal physician should have provisions in place for breakthrough pain.  If you are in crisis, you should call your physician.  If your physician directs you to the emergency department, please have the doctor call and speak to our attending physician concerning your care.   When patients come to the Emergency Department (ED) with acute medical conditions in which the Emergency Department physician feels appropriate to prescribe narcotic or sedating pain medication, the physician will prescribe these in very limited quantities.  The amount of these medications will last only until you can see your primary care physician in his/her office.  Any patient who returns to the ED seeking refills should expect only non-narcotic pain medications.   In the event of an acute medical condition exists and the emergency physician feels it is necessary that the patient be given a narcotic or sedating medication -  a responsible adult driver should be present in the room prior to the medication being given by the nurse.   Prescriptions for narcotic or sedating medications that have been lost, stolen or expired will not be refilled in the Emergency  Department.    Patients who have chronic pain may receive non-narcotic prescriptions until seen by their primary care physician.  It is every patients personal responsibility to maintain active prescriptions with his or her primary care physician or specialist.  Community Resource Guide Chronic Pain [employer] is a great source of information about community services available.  Access by dialing 2-1-1 from  anywhere in [place], or by website -  [employer].   Other Local Resources (Updated 04/2015)   Chronic Pain   Services     Phone Number and Address  [facility]  Pain management is offered by board-certified physician specialists, physical therapists, occupational therapists, and other professionals  Support groups [phone] [address], [place], [place], [place] [address]  [facility]  Various pain management strategies are offered, including medication, acupuncture, and manual therapy  Must be referred by primary care doctor [phone] [address], [place]   [facility], [place]  Pain management clinic  Must be referred by primary care doctor [phone] N. [address], [place], [facility]  Pain management is offered by board-certified physician specialists [phone] [address] [place], [place] [address]

## 2015-06-12 NOTE — ED Notes (Signed)
Pt refusing to wait in room, told repeated to wait or we would call security. Pt states the shot was like water, will not help him, he is leaving. Pt at nursing station will sign out AMA, discharge paper not given, pt would not wait.

## 2015-06-18 ENCOUNTER — Emergency Department (HOSPITAL_COMMUNITY)
Admission: EM | Admit: 2015-06-18 | Discharge: 2015-06-18 | Disposition: A | Discharge status: Home / Self Care | Payer: MEDICAID | Attending: Emergency Medicine | Admitting: Emergency Medicine

## 2015-06-18 ENCOUNTER — Encounter (HOSPITAL_COMMUNITY): Payer: Self-pay | Admitting: Emergency Medicine

## 2015-06-18 DIAGNOSIS — F17210 Nicotine dependence, cigarettes, uncomplicated: Secondary | ICD-10-CM | POA: Insufficient documentation

## 2015-06-18 DIAGNOSIS — M545 Low back pain: Secondary | ICD-10-CM | POA: Insufficient documentation

## 2015-06-18 DIAGNOSIS — G8929 Other chronic pain: Secondary | ICD-10-CM | POA: Insufficient documentation

## 2015-06-18 DIAGNOSIS — G40909 Epilepsy, unspecified, not intractable, without status epilepticus: Secondary | ICD-10-CM | POA: Insufficient documentation

## 2015-06-18 NOTE — ED Provider Notes (Signed)
Patient seen/examined in the Emergency Department in conjunction with Midlevel Provider [place] Patient reports chronic back pain since he was 40 yrs old Exam : awake/alert, no distress, moves legs without difficulty Plan: d/c home, needs chronic pain f/u    Ripley Fraise, MD 06/18/15 2010

## 2015-06-18 NOTE — Discharge Instructions (Signed)

## 2015-06-18 NOTE — ED Provider Notes (Signed)
CSN: [id number]     Arrival date & time 06/18/15  1833 History   First MD Initiated Contact with Patient 06/18/15 1857     Chief Complaint  Patient presents with  . Back Pain     (Consider location/radiation/quality/duration/timing/severity/associated sxs/prior Treatment) HPI  Nathan Mckay is a 40 y.o. male who presents to the Emergency Department complaining of worsening of his chronic low back pain.  He states he has had pain to his lower back since 40 years old secondary to an injury. He also states that he was seeing a pain management provider and was released from the practice due to being non-compliant.  He is requesting dilaudid tablets or methadone, stating that those medications are the only things that help his pain.  He denies recent injury, numbness or weakness of the LE, abd pain, urine or bowel changes, fever or chills.   Past Medical History  Diagnosis Date  . Chronic headaches   . Chronic back pain   . Polysubstance abuse   . Recurrent shoulder dislocation   . Sciatica   . Fibromyalgia   . Seizures ([place])     states epilepsy was dx but they stopped his meds for seizures and he stopped his meds and the seizures stopped. last seizure was a year ago.   Past Surgical History  Procedure Laterality Date  . Hand surgery Right     broken hand  . Wrist surgery Right     fractured hand and wrist.  . Colonoscopy with propofol N/A 08/10/2014    Procedure: COLONOSCOPY WITH PROPOFOL (Incomplete exam-to Desecending Colon, Cecum seen from a distance);  Surgeon: Rogene Houston, MD;  Location: [facility];  Service: Endoscopy;  Laterality: N/A;  . Polypectomy  08/10/2014    Procedure: POLYPECTOMY;  Surgeon: Rogene Houston, MD;  Location: [facility];  Service: Endoscopy;;   Family History  Problem Relation Age of Onset  . Cancer Mother   . Cancer Other   . Heart failure Other    Social History  Substance Use Topics  . Smoking status: Current Every Day Smoker -- 1.00 packs/day for  27 years    Types: Cigarettes  . Smokeless tobacco: Never Used  . Alcohol Use: No    Review of Systems  Constitutional: Negative for fever.  Respiratory: Negative for shortness of breath.   Gastrointestinal: Negative for vomiting, abdominal pain and constipation.  Genitourinary: Negative for dysuria, hematuria, flank pain, decreased urine volume and difficulty urinating.  Musculoskeletal: Positive for back pain. Negative for joint swelling.  Skin: Negative for rash.  Neurological: Negative for weakness and numbness.  All other systems reviewed and are negative.     Allergies  Nickel  Home Medications   Prior to Admission medications   Medication Sig Start Date End Date Taking? Authorizing Provider  acetaminophen (TYLENOL) 500 MG tablet Take 1,000 mg by mouth every 6 (six) hours as needed for headache.    Historical Provider, MD  cyclobenzaprine (FLEXERIL) 10 MG tablet Take 1 tablet (10 mg total) by mouth 2 (two) times daily as needed for muscle spasms. Patient not taking: Reported on 05/22/2015 03/21/15   Milton Ferguson, MD  diclofenac (VOLTAREN) 75 MG EC tablet Take 1 tablet (75 mg total) by mouth 2 (two) times daily. Patient not taking: Reported on 05/22/2015 02/23/15   Lily Kocher, PA-C  naproxen (NAPROSYN) 500 MG tablet Take 1 po BID with food prn pain Patient not taking: Reported on 03/21/2015 12/18/14   Rolland Porter, MD  oxyCODONE-acetaminophen (PERCOCET) 5-325 MG tablet Take 1 tablet by mouth every 4 (four) hours as needed. 05/22/15   Daleen Bo, MD   BP 104/72 mmHg  Pulse 113  Temp(Src) 98.4 F (36.9 C) (Oral)  Resp 20  Ht 5\' 9"  (1.753 m)  Wt 86.183 kg  BMI 28.05 kg/m2  SpO2 97% Physical Exam  Constitutional: He is oriented to person, place, and time. He appears well-developed and well-nourished. No distress.  HENT:  Head: Normocephalic and atraumatic.  Neck: Normal range of motion. Neck supple.  Cardiovascular: Normal rate, regular rhythm, normal heart sounds and  intact distal pulses.   No murmur heard. Pulmonary/Chest: Effort normal and breath sounds normal. No respiratory distress.  Abdominal: Soft. He exhibits no distension. There is no tenderness.  Musculoskeletal: He exhibits tenderness. He exhibits no edema.       Lumbar back: He exhibits tenderness and pain. He exhibits normal range of motion, no swelling, no deformity, no laceration and normal pulse.  Diffuse ttp of the lower lumbar spine and bilateral paraspinal muscles.  DP pulses are brisk and symmetrical.  Distal sensation intact.  Pt has 5/5 strength against resistance of bilateral lower extremities.     Neurological: He is alert and oriented to person, place, and time. He has normal strength. No sensory deficit. He exhibits normal muscle tone. Coordination and gait normal.  Reflex Scores:      Patellar reflexes are 2+ on the right side and 2+ on the left side.      Achilles reflexes are 2+ on the right side and 2+ on the left side. Skin: Skin is warm and dry. No rash noted.  Nursing note and vitals reviewed.   ED Course  Procedures (including critical care time) Labs Review Labs Reviewed - No data to display  Imaging Review No results found. I have personally reviewed and evaluated these images and lab results as part of my medical decision-making.   EKG Interpretation None      MDM   Final diagnoses:  Chronic low back pain    Pt ambulates with a steady gait.  No focal neuro deficits.  No exam findings to suggest emergent neurological process.  Pt is requesting Dilaudid tabs or Methadone, I have explained that those medications are not indicated given his physical findings.  I have advised pt that he needs to arrange care with a pain management clinic  Pt requesting to see MD after I explained that those medications are not indicated.  Pt also seen by Dr. Christy Gentles and care plan discussed. Pt was offered NSAID medication and muscle relaxer, but refused.  He appears stable  for d/c   Kem Parkinson, PA-C 06/20/15 1321  Ripley Fraise, MD 06/21/15 1406

## 2015-06-18 NOTE — ED Notes (Signed)
Back pain ongoing for several years. Pt states he has been seen multiple times and "noone will help him". Pt states the pain radiates down both legs.

## 2015-06-18 NOTE — ED Notes (Signed)
Pt left without receiving discharge instructions or signing.

## 2015-06-19 ENCOUNTER — Encounter (HOSPITAL_COMMUNITY): Payer: Self-pay | Admitting: Emergency Medicine

## 2015-06-19 ENCOUNTER — Emergency Department (HOSPITAL_COMMUNITY)
Admission: EM | Admit: 2015-06-19 | Discharge: 2015-06-19 | Disposition: A | Discharge status: Home / Self Care | Payer: MEDICAID | Attending: Emergency Medicine | Admitting: Emergency Medicine

## 2015-06-19 DIAGNOSIS — G40909 Epilepsy, unspecified, not intractable, without status epilepticus: Secondary | ICD-10-CM | POA: Insufficient documentation

## 2015-06-19 DIAGNOSIS — X19XXXA Contact with other heat and hot substances, initial encounter: Secondary | ICD-10-CM | POA: Insufficient documentation

## 2015-06-19 DIAGNOSIS — T23132A Burn of first degree of multiple left fingers (nail), not including thumb, initial encounter: Secondary | ICD-10-CM

## 2015-06-19 DIAGNOSIS — T310 Burns involving less than 10% of body surface: Secondary | ICD-10-CM | POA: Insufficient documentation

## 2015-06-19 DIAGNOSIS — Y999 Unspecified external cause status: Secondary | ICD-10-CM | POA: Insufficient documentation

## 2015-06-19 DIAGNOSIS — T23102A Burn of first degree of left hand, unspecified site, initial encounter: Secondary | ICD-10-CM

## 2015-06-19 DIAGNOSIS — T23121A Burn of first degree of single right finger (nail) except thumb, initial encounter: Secondary | ICD-10-CM | POA: Insufficient documentation

## 2015-06-19 DIAGNOSIS — Y929 Unspecified place or not applicable: Secondary | ICD-10-CM | POA: Insufficient documentation

## 2015-06-19 DIAGNOSIS — Y9389 Activity, other specified: Secondary | ICD-10-CM | POA: Insufficient documentation

## 2015-06-19 DIAGNOSIS — F17210 Nicotine dependence, cigarettes, uncomplicated: Secondary | ICD-10-CM | POA: Insufficient documentation

## 2015-06-19 MED ORDER — IBUPROFEN 800 MG PO TABS
800.0000 mg | ORAL_TABLET | Freq: Once | ORAL | Status: AC
  Administered 2015-06-19: 800 mg via ORAL
  Filled 2015-06-19: qty 1

## 2015-06-19 MED ORDER — SILVER SULFADIAZINE 1 % EX CREA
TOPICAL_CREAM | Freq: Once | CUTANEOUS | Status: AC
  Administered 2015-06-19: 23:00:00 via TOPICAL
  Filled 2015-06-19: qty 50

## 2015-06-19 MED ORDER — IBUPROFEN 800 MG PO TABS: 800.0000 mg | ORAL_TABLET | Freq: Three times a day (TID) | ORAL | Status: DC

## 2015-06-19 NOTE — Discharge Instructions (Signed)
Burn Care °Burns hurt your skin. When your skin is hurt, it is easier to get an infection. Follow your doctor's directions to help prevent an infection. °HOME CARE °· Wash your hands well before you change your bandage. °· Change your bandage as often as told by your doctor. °¨ Remove the old bandage. If the bandage sticks, soak it off with cool, clean water. °¨ Gently clean the burn with mild soap and water. °¨ Pat the burn dry with a clean, dry cloth. °¨ Put a thin layer of medicated cream on the burn. °¨ Put a clean bandage on as told by your doctor. °¨ Keep the bandage clean and dry. °· Raise (elevate) the burn for the first 24 hours. After that, follow your doctor's directions. °· Only take medicine as told by your doctor. °GET HELP RIGHT AWAY IF:  °· You have too much pain. °· The skin near the burn is red, tender, puffy (swollen), or has red streaks. °· The burn area has yellowish white fluid (pus) or a bad smell coming from it. °· You have a fever. °MAKE SURE YOU:  °· Understand these instructions. °· Will watch your condition. °· Will get help right away if you are not doing well or get worse. °  °This information is not intended to replace advice given to you by your health care provider. Make sure you discuss any questions you have with your health care provider. °  °Document Released: 01/07/2008 Document Revised: 06/22/2011 Document Reviewed: 08/20/2010 °Elsevier Interactive Patient Education ©2016 Elsevier Inc. ° °

## 2015-06-19 NOTE — ED Notes (Signed)
Pt states he grabbed a metal pan handle from the oven with his bare hand.

## 2015-06-19 NOTE — ED Notes (Signed)
Pt c/o left hand burn after picking up a hot pan.

## 2015-06-22 NOTE — ED Provider Notes (Signed)
CSN: [id number]     Arrival date & time 06/19/15  2021 History   First MD Initiated Contact with Patient 06/19/15 2212     Chief Complaint  Patient presents with  . Hand Burn     (Consider location/radiation/quality/duration/timing/severity/associated sxs/prior Treatment) HPI  Nathan Mckay is a 40 y.o. male who presents to the Emergency Department complaining of burn to his left hand that occurred when he accidentally picked up a hot pan.  He reports burning and stinging to his hand.  He denies open wounds, blisters, numbness of the hand.  He has applied ice to his hand prior to arrival.  Td up to date per patient.   Past Medical History  Diagnosis Date  . Chronic headaches   . Chronic back pain   . Polysubstance abuse   . Recurrent shoulder dislocation   . Sciatica   . Fibromyalgia   . Seizures ([place])     states epilepsy was dx but they stopped his meds for seizures and he stopped his meds and the seizures stopped. last seizure was a year ago.   Past Surgical History  Procedure Laterality Date  . Hand surgery Right     broken hand  . Wrist surgery Right     fractured hand and wrist.  . Colonoscopy with propofol N/A 08/10/2014    Procedure: COLONOSCOPY WITH PROPOFOL (Incomplete exam-to Desecending Colon, Cecum seen from a distance);  Surgeon: Rogene Houston, MD;  Location: [facility];  Service: Endoscopy;  Laterality: N/A;  . Polypectomy  08/10/2014    Procedure: POLYPECTOMY;  Surgeon: Rogene Houston, MD;  Location: [facility];  Service: Endoscopy;;   Family History  Problem Relation Age of Onset  . Cancer Mother   . Cancer Other   . Heart failure Other    Social History  Substance Use Topics  . Smoking status: Current Every Day Smoker -- 1.00 packs/day for 27 years    Types: Cigarettes  . Smokeless tobacco: Never Used  . Alcohol Use: No    Review of Systems  Constitutional: Negative for fever and chills.  Genitourinary: Negative for dysuria and difficulty urinating.   Musculoskeletal: Negative for joint swelling and arthralgias.  Skin: Negative for wound. Color change: burn left hand.  Neurological: Negative for numbness.  All other systems reviewed and are negative.     Allergies  Nickel  Home Medications   Prior to Admission medications   Medication Sig Start Date End Date Taking? Authorizing Provider  acetaminophen (TYLENOL) 500 MG tablet Take 1,000 mg by mouth every 6 (six) hours as needed for headache.    Historical Provider, MD  cyclobenzaprine (FLEXERIL) 10 MG tablet Take 1 tablet (10 mg total) by mouth 2 (two) times daily as needed for muscle spasms. Patient not taking: Reported on 05/22/2015 03/21/15   Milton Ferguson, MD  diclofenac (VOLTAREN) 75 MG EC tablet Take 1 tablet (75 mg total) by mouth 2 (two) times daily. Patient not taking: Reported on 05/22/2015 02/23/15   Lily Kocher, PA-C  ibuprofen (ADVIL,MOTRIN) 800 MG tablet Take 1 tablet (800 mg total) by mouth 3 (three) times daily. 06/19/15   Atley Neubert, PA-C  naproxen (NAPROSYN) 500 MG tablet Take 1 po BID with food prn pain Patient not taking: Reported on 03/21/2015 12/18/14   Rolland Porter, MD  oxyCODONE-acetaminophen (PERCOCET) 5-325 MG tablet Take 1 tablet by mouth every 4 (four) hours as needed. Patient not taking: Reported on 06/19/2015 05/22/15   Daleen Bo, MD   BP  110/86 mmHg  Pulse 81  Temp(Src) 98.8 F (37.1 C) (Oral)  Resp 16  Ht 5\' 9"  (1.753 m)  Wt 86.183 kg  BMI 28.05 kg/m2  SpO2 96% Physical Exam  Constitutional: He is oriented to person, place, and time. He appears well-developed and well-nourished. No distress.  HENT:  Head: Normocephalic and atraumatic.  Cardiovascular: Normal rate, regular rhythm and intact distal pulses.   Pulmonary/Chest: Effort normal and breath sounds normal. No respiratory distress.  Musculoskeletal: Normal range of motion. He exhibits tenderness. He exhibits no edema.  Pt has full ROM of the fingers of the left hand. Sensation intact   Neurological: He is alert and oriented to person, place, and time. He exhibits normal muscle tone. Coordination normal.  Skin: Skin is warm. No rash noted. There is erythema.  minimal erythema of the finger tips of the left hand and two smaller areas to the left palm.  No blistering.  No open wounds.    Nursing note and vitals reviewed.   ED Course  Procedures (including critical care time) Labs Review Labs Reviewed - No data to display  Imaging Review No results found. I have personally reviewed and evaluated these images and lab results as part of my medical decision-making.   EKG Interpretation None      MDM   Final diagnoses:  Burn of hand including fingers, left, first degree, initial encounter    First degree burn of the hand with minimal erythema of the finger tips. NV intact.  Reports that TD is UTD.  Has full ROM of the fingers.  Patient requesting dilaudid tablets.  Burns appear minor, explained that dilaudid tabs are not indicated.    Burned cleaned and dressed with silvadene. rx for ibuprofen.  Patient advised to return for any signs of infection.  Appears stable for d/c   Kem Parkinson, PA-C 06/23/15 0011  Milton Ferguson, MD 06/25/15 [phone]

## 2015-07-09 DIAGNOSIS — Y92009 Unspecified place in unspecified non-institutional (private) residence as the place of occurrence of the external cause: Secondary | ICD-10-CM | POA: Insufficient documentation

## 2015-07-09 DIAGNOSIS — F17210 Nicotine dependence, cigarettes, uncomplicated: Secondary | ICD-10-CM | POA: Insufficient documentation

## 2015-07-09 DIAGNOSIS — S61211A Laceration without foreign body of left index finger without damage to nail, initial encounter: Secondary | ICD-10-CM | POA: Insufficient documentation

## 2015-07-09 DIAGNOSIS — Y999 Unspecified external cause status: Secondary | ICD-10-CM | POA: Insufficient documentation

## 2015-07-09 DIAGNOSIS — W260XXA Contact with knife, initial encounter: Secondary | ICD-10-CM | POA: Insufficient documentation

## 2015-07-09 DIAGNOSIS — Y9389 Activity, other specified: Secondary | ICD-10-CM | POA: Insufficient documentation

## 2015-07-10 ENCOUNTER — Encounter (HOSPITAL_COMMUNITY): Payer: Self-pay | Admitting: Emergency Medicine

## 2015-07-10 ENCOUNTER — Emergency Department (HOSPITAL_COMMUNITY)
Admission: EM | Admit: 2015-07-10 | Discharge: 2015-07-10 | Disposition: A | Discharge status: Home / Self Care | Payer: MEDICAID | Attending: Emergency Medicine | Admitting: Emergency Medicine

## 2015-07-10 DIAGNOSIS — S61211A Laceration without foreign body of left index finger without damage to nail, initial encounter: Secondary | ICD-10-CM

## 2015-07-10 MED ORDER — LIDOCAINE HCL (PF) 1 % IJ SOLN: 5.0000 mL | Freq: Once | INTRAMUSCULAR | Status: DC

## 2015-07-10 MED ORDER — BACITRACIN ZINC 500 UNIT/GM EX OINT: TOPICAL_OINTMENT | Freq: Once | CUTANEOUS | Status: DC

## 2015-07-10 NOTE — ED Notes (Signed)
Pt sleeping, lac tray set up in room

## 2015-07-10 NOTE — ED Notes (Signed)
Patient given discharge instruction, verbalized understand. Patient ambulatory out of the department.  

## 2015-07-10 NOTE — ED Provider Notes (Signed)
TIME SEEN: 4:00 AM  CHIEF COMPLAINT: Laceration to the second left digit  HPI: Pt is a 40 y.o. male with history of chronic pain and substance abuse who presents emergency department with a superficial laceration to the ulnar aspect of the left second digit next to the PIP. States he was cutting something with a knife at home. Denies any other injury. States his tetanus has been in the last 5 years.  ROS: See HPI Constitutional: no fever  Eyes: no drainage  ENT: no runny nose   Cardiovascular:  no chest pain  Resp: no SOB  GI: no vomiting GU: no dysuria Integumentary: no rash  Allergy: no hives  Musculoskeletal: no leg swelling  Neurological: no slurred speech ROS otherwise negative  PAST MEDICAL HISTORY/PAST SURGICAL HISTORY:  Past Medical History  Diagnosis Date  . Chronic headaches   . Chronic back pain   . Polysubstance abuse   . Recurrent shoulder dislocation   . Sciatica   . Fibromyalgia   . Seizures ([place])     states epilepsy was dx but they stopped his meds for seizures and he stopped his meds and the seizures stopped. last seizure was a year ago.    MEDICATIONS:  Prior to Admission medications   Medication Sig Start Date End Date Taking? Authorizing Provider  acetaminophen (TYLENOL) 500 MG tablet Take 1,000 mg by mouth every 6 (six) hours as needed for headache.    Historical Provider, MD  cyclobenzaprine (FLEXERIL) 10 MG tablet Take 1 tablet (10 mg total) by mouth 2 (two) times daily as needed for muscle spasms. Patient not taking: Reported on 05/22/2015 03/21/15   Milton Ferguson, MD  diclofenac (VOLTAREN) 75 MG EC tablet Take 1 tablet (75 mg total) by mouth 2 (two) times daily. Patient not taking: Reported on 05/22/2015 02/23/15   Lily Kocher, PA-C  ibuprofen (ADVIL,MOTRIN) 800 MG tablet Take 1 tablet (800 mg total) by mouth 3 (three) times daily. 06/19/15   Tammy Triplett, PA-C  naproxen (NAPROSYN) 500 MG tablet Take 1 po BID with food prn pain Patient not taking:  Reported on 03/21/2015 12/18/14   Rolland Porter, MD  oxyCODONE-acetaminophen (PERCOCET) 5-325 MG tablet Take 1 tablet by mouth every 4 (four) hours as needed. Patient not taking: Reported on 06/19/2015 05/22/15   Daleen Bo, MD    ALLERGIES:  Allergies  Allergen Reactions  . Nickel Rash    SOCIAL HISTORY:  Social History  Substance Use Topics  . Smoking status: Current Every Day Smoker -- 1.00 packs/day for 27 years    Types: Cigarettes  . Smokeless tobacco: Never Used  . Alcohol Use: No    FAMILY HISTORY: Family History  Problem Relation Age of Onset  . Cancer Mother   . Cancer Other   . Heart failure Other     EXAM: BP 102/67 mmHg  Pulse 92  Temp(Src) 97.8 F (36.6 C) (Oral)  Resp 16  Ht 5\' 9"  (1.753 m)  Wt 200 lb (90.719 kg)  BMI 29.52 kg/m2  SpO2 98% CONSTITUTIONAL: Alert and oriented and responds appropriately to questions. Chronically ill; well-nourished HEAD: Normocephalic EYES: Conjunctivae clear, PERRL ENT: normal nose; no rhinorrhea; moist mucous membranes NECK: Supple, no meningismus, no LAD  CARD: RRR; S1 and S2 appreciated; no murmurs, no clicks, no rubs, no gallops RESP: Normal chest excursion without splinting or tachypnea; breath sounds clear and equal bilaterally; no wheezes, no rhonchi, no rales, no hypoxia or respiratory distress, speaking full sentences ABD/GI: Normal bowel sounds; non-distended; soft,  non-tender, no rebound, no guarding, no peritoneal signs BACK:  The back appears normal and is non-tender to palpation, there is no CVA tenderness EXT: Normal ROM in all joints; non-tender to palpation; no edema; normal capillary refill; no cyanosis, no calf tenderness or swelling, 2+ radial pulses bilaterally.    SKIN: Normal color for age and race; warm; no rash, superficial laceration that is 1 cm in length noted to the ulnar aspect of the left second digit at the PIP. No tendon involvement or foreign body appreciated. NEURO: Moves all extremities  equally, sensation to light touch intact diffusely, cranial nerves II through XII intact PSYCH: The patient's mood and manner are appropriate. Grooming and personal hygiene are appropriate.  MEDICAL DECISION MAKING: Patient here with extremely superficial laceration to the left second digit. I do not feel he requires sutures as it is very superficial. We have applied a small amount of Dermabond to this area will place him in a finger splint because he is worried if he bends his finger this may open up more. No tendon involvement and he is neurovascularly intact distally. No foreign body appreciated. States his tetanus is up-to-date. I feel he is safe to be discharged. I do not feel he needs an x-ray of his hand. He is requesting narcotics for pain which I do not feel is appropriate. Have advised him to use Motrin OTC.   SPLINT APPLICATION Date/Time: [id number] AM Authorized by: Nyra Jabs Consent: Verbal consent obtained. Risks and benefits: risks, benefits and alternatives were discussed Consent given by: patient Splint applied by: nurse Location details: Left second digit  Splint type: Aluminum splint  Supplies used: Aluminum splint  Post-procedure: The splinted body part was neurovascularly unchanged following the procedure. Patient tolerance: Patient tolerated the procedure well with no immediate complications.      LACERATION REPAIR Performed by: Nyra Jabs Authorized by: Nyra Jabs Consent: Verbal consent obtained. Risks and benefits: risks, benefits and alternatives were discussed Consent given by: patient Patient identity confirmed: provided demographic data Prepped and Draped in normal sterile fashion Wound explored  Laceration Location: Left second digit  Laceration Length: 1 cm  No Foreign Bodies seen or palpated  Anesthesia: local infiltration  Local anesthetic: None   Anesthetic total: 0 ml  Irrigation method: syringe Amount of cleaning:  standard  Skin closure: Simple using Dermabond   Number of sutures: Dermabond used   Technique: Area irrigated and Dermabond applied. Sterile dressing applied and aluminum finger splint for protection.   Patient tolerance: Patient tolerated the procedure well with no immediate complications.   [place], DO 07/10/15 [phone]

## 2015-07-10 NOTE — Discharge Instructions (Signed)

## 2015-07-10 NOTE — ED Notes (Addendum)
Finger splint applied, Tina Rn applied [employer]

## 2015-07-10 NOTE — ED Notes (Signed)
Patient states he cut his left first finger on a pocket knife tonight approximately 15 minutes prior to arrival to ED. Patient has approximately 1/2" laceration noted to left first finger. Bleeding controlled at triage.

## 2015-08-23 ENCOUNTER — Emergency Department (HOSPITAL_COMMUNITY)
Admission: EM | Admit: 2015-08-23 | Discharge: 2015-08-24 | Disposition: A | Discharge status: Home / Self Care | Payer: MEDICAID | Attending: Emergency Medicine | Admitting: Emergency Medicine

## 2015-08-23 ENCOUNTER — Encounter (HOSPITAL_COMMUNITY): Payer: Self-pay

## 2015-08-23 DIAGNOSIS — M545 Low back pain: Secondary | ICD-10-CM | POA: Insufficient documentation

## 2015-08-23 DIAGNOSIS — G8929 Other chronic pain: Secondary | ICD-10-CM | POA: Insufficient documentation

## 2015-08-23 DIAGNOSIS — Z79899 Other long term (current) drug therapy: Secondary | ICD-10-CM | POA: Insufficient documentation

## 2015-08-23 DIAGNOSIS — Z7982 Long term (current) use of aspirin: Secondary | ICD-10-CM | POA: Insufficient documentation

## 2015-08-23 DIAGNOSIS — F17210 Nicotine dependence, cigarettes, uncomplicated: Secondary | ICD-10-CM | POA: Insufficient documentation

## 2015-08-23 NOTE — ED Notes (Signed)
Pt states he twisted 2 days ago & has flared has back pain to the point he cant take.

## 2015-08-23 NOTE — ED Notes (Signed)
Pt reports chronic lower back problems "for years"  States he recently has "tweaked it" and the pain is intolerable now.

## 2015-08-24 MED ORDER — KETOROLAC TROMETHAMINE 60 MG/2ML IM SOLN
60.0000 mg | Freq: Once | INTRAMUSCULAR | Status: AC
  Administered 2015-08-24: 60 mg via INTRAMUSCULAR
  Filled 2015-08-24: qty 2

## 2015-08-24 MED ORDER — DICLOFENAC SODIUM 50 MG PO TBEC: 50.0000 mg | DELAYED_RELEASE_TABLET | Freq: Two times a day (BID) | ORAL | Status: AC

## 2015-08-24 MED ORDER — METHYLPREDNISOLONE SODIUM SUCC 125 MG IJ SOLR
125.0000 mg | Freq: Once | INTRAMUSCULAR | Status: AC
  Administered 2015-08-24: 125 mg via INTRAMUSCULAR
  Filled 2015-08-24: qty 2

## 2015-08-24 MED ORDER — METHOCARBAMOL 500 MG PO TABS: 500.0000 mg | ORAL_TABLET | Freq: Four times a day (QID) | ORAL | Status: DC

## 2015-08-24 NOTE — Discharge Instructions (Signed)

## 2015-08-24 NOTE — ED Notes (Signed)
Pt says he wants somebody to take him serious about the pain he is in. Wants to be able to control his life.

## 2015-08-24 NOTE — ED Notes (Signed)
Pt alert & oriented x4, stable gait. Patient given discharge instructions, paperwork & prescription(s). Patient  instructed to stop at the registration desk to finish any additional paperwork. Patient verbalized understanding. Pt left department w/ no further questions. 

## 2015-08-24 NOTE — ED Provider Notes (Signed)
CSN: [id number]     Arrival date & time 08/23/15  2317 History   First MD Initiated Contact with Patient 08/24/15 0003     Chief Complaint  Patient presents with  . Back Pain     (Consider location/radiation/quality/duration/timing/severity/associated sxs/prior Treatment) Patient is a 40 y.o. male presenting with back pain. The history is provided by the patient. No language interpreter was used.  Back Pain Location:  Lumbar spine Quality:  Aching Radiates to:  Does not radiate Pain severity:  Moderate Pain is:  Worse during the night Onset quality:  Gradual Duration: years. Timing:  Constant Progression:  Worsening Chronicity:  Chronic Relieved by:  Nothing Worsened by:  Nothing tried Ineffective treatments:  None tried Associated symptoms: no abdominal pain   Pt reports back pain since age 27.  Pt reports he needs pain management.    Past Medical History  Diagnosis Date  . Chronic headaches   . Chronic back pain   . Polysubstance abuse   . Recurrent shoulder dislocation   . Sciatica   . Fibromyalgia   . Seizures ([place])     states epilepsy was dx but they stopped his meds for seizures and he stopped his meds and the seizures stopped. last seizure was a year ago.   Past Surgical History  Procedure Laterality Date  . Hand surgery Right     broken hand  . Wrist surgery Right     fractured hand and wrist.  . Colonoscopy with propofol N/A 08/10/2014    Procedure: COLONOSCOPY WITH PROPOFOL (Incomplete exam-to Desecending Colon, Cecum seen from a distance);  Surgeon: Rogene Houston, MD;  Location: [facility];  Service: Endoscopy;  Laterality: N/A;  . Polypectomy  08/10/2014    Procedure: POLYPECTOMY;  Surgeon: Rogene Houston, MD;  Location: [facility];  Service: Endoscopy;;   Family History  Problem Relation Age of Onset  . Cancer Mother   . Cancer Other   . Heart failure Other    Social History  Substance Use Topics  . Smoking status: Current Every Day Smoker -- 1.00  packs/day for 27 years    Types: Cigarettes  . Smokeless tobacco: Never Used  . Alcohol Use: No    Review of Systems  Gastrointestinal: Negative for abdominal pain.  Musculoskeletal: Positive for back pain.  All other systems reviewed and are negative.     Allergies  Nickel  Home Medications   Prior to Admission medications   Medication Sig Start Date End Date Taking? Authorizing Provider  acetaminophen (TYLENOL) 500 MG tablet Take 1,000 mg by mouth every 6 (six) hours as needed for headache.   Yes Historical Provider, MD  ibuprofen (ADVIL,MOTRIN) 800 MG tablet Take 1 tablet (800 mg total) by mouth 3 (three) times daily. 06/19/15  Yes Tammy Triplett, PA-C   BP 112/77 mmHg  Pulse 76  Temp(Src) 99 F (37.2 C) (Oral)  Resp 20  Ht 5\' 10"  (1.778 m)  Wt 86.183 kg  BMI 27.26 kg/m2  SpO2 98% Physical Exam  Constitutional: He is oriented to person, place, and time. He appears well-developed and well-nourished.  HENT:  Head: Normocephalic.  Eyes: EOM are normal.  Neck: Normal range of motion.  Cardiovascular: Normal rate.   Pulmonary/Chest: Effort normal.  Abdominal: He exhibits no distension.  Musculoskeletal: Normal range of motion.  Tender ls spine diffusely  Neurological: He is alert and oriented to person, place, and time.  Skin: Skin is warm.  Psychiatric: He has a normal mood and affect.  Nursing note and vitals reviewed.   ED Course  Procedures (including critical care time) Labs Review Labs Reviewed - No data to display  Imaging Review No results found. I have personally reviewed and evaluated these images and lab results as part of my medical decision-making.   EKG Interpretation None      [facility] wants narcotic pain medication.  Pt states if he gets arrested getting what he needs it is my fault.    Final diagnoses:  Chronic pain    Pt offered torodol and solumedrol.   Rx for voltaren and robaxin An After Visit Summary was printed and given to the  patient.    Hollace Kinnier Lockhart, PA-C 08/24/15 [id number]  Rolland Porter, MD 08/24/15 [phone]

## 2016-07-23 ENCOUNTER — Encounter (HOSPITAL_COMMUNITY): Payer: Self-pay

## 2016-07-23 ENCOUNTER — Emergency Department (HOSPITAL_COMMUNITY)
Admission: EM | Admit: 2016-07-23 | Discharge: 2016-07-23 | Disposition: A | Discharge status: Home / Self Care | Payer: Self-pay | Attending: Emergency Medicine | Admitting: Emergency Medicine

## 2016-07-23 DIAGNOSIS — R51 Headache: Secondary | ICD-10-CM | POA: Insufficient documentation

## 2016-07-23 DIAGNOSIS — F17210 Nicotine dependence, cigarettes, uncomplicated: Secondary | ICD-10-CM | POA: Insufficient documentation

## 2016-07-23 DIAGNOSIS — R0981 Nasal congestion: Secondary | ICD-10-CM | POA: Insufficient documentation

## 2016-07-23 DIAGNOSIS — M5441 Lumbago with sciatica, right side: Secondary | ICD-10-CM | POA: Insufficient documentation

## 2016-07-23 DIAGNOSIS — M5442 Lumbago with sciatica, left side: Secondary | ICD-10-CM | POA: Insufficient documentation

## 2016-07-23 DIAGNOSIS — G8929 Other chronic pain: Secondary | ICD-10-CM | POA: Insufficient documentation

## 2016-07-23 MED ORDER — IBUPROFEN 600 MG PO TABS: 600.0000 mg | ORAL_TABLET | Freq: Four times a day (QID) | ORAL | 0 refills | Status: AC | PRN

## 2016-07-23 MED ORDER — FLUTICASONE PROPIONATE 50 MCG/ACT NA SUSP: 2.0000 | Freq: Every day | NASAL | 0 refills | Status: AC

## 2016-07-23 MED ORDER — METHOCARBAMOL 500 MG PO TABS: 500.0000 mg | ORAL_TABLET | Freq: Three times a day (TID) | ORAL | 0 refills | Status: AC | PRN

## 2016-07-23 MED ORDER — KETOROLAC TROMETHAMINE 60 MG/2ML IM SOLN
60.0000 mg | Freq: Once | INTRAMUSCULAR | Status: AC
  Administered 2016-07-23: 60 mg via INTRAMUSCULAR
  Filled 2016-07-23: qty 2

## 2016-07-23 MED ORDER — LORATADINE 10 MG PO TABS: 10.0000 mg | ORAL_TABLET | Freq: Every day | ORAL | 0 refills | Status: AC

## 2016-07-23 NOTE — ED Triage Notes (Signed)
Pt c/o headache and back pain, states is taking tylenol prn and xanax.

## 2016-07-23 NOTE — ED Provider Notes (Signed)
[facility] DEPT Provider Note   CSN: [id number] Arrival date & time: 07/23/16  0326     History   Chief Complaint Chief Complaint  Patient presents with  . Back Pain    HPI Nathan Mckay is a 41 y.o. male.  HPI Patient history of chronic pain presents with ongoing low back pain and headaches. States the pain has not worsened or significantly changed. He is taking over-the-counter Tylenol and occasional Xanax. States low back pain radiates down both legs. No focal weakness or numbness. No incontinence. No fever or chills. Patient also complains generalized headache. Endorses nasal congestion. States headache is throbbing in nature. No photophobia or nausea. No neck pain or stiffness. Denies visual changes. Past Medical History:  Diagnosis Date  . Chronic back pain   . Chronic headaches   . Fibromyalgia   . Polysubstance abuse   . Recurrent shoulder dislocation   . Sciatica   . Seizures ([place])    states epilepsy was dx but they stopped his meds for seizures and he stopped his meds and the seizures stopped. last seizure was a year ago.    Patient Active Problem List   Diagnosis Date Noted  . 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 Hypotension 07/13/2012  . Methadone overdose 07/13/2012  . Polysubstance abuse     Past Surgical History:  Procedure Laterality Date  . COLONOSCOPY WITH PROPOFOL N/A 08/10/2014   Procedure: COLONOSCOPY WITH PROPOFOL (Incomplete exam-to Desecending Colon, Cecum seen from a distance);  Surgeon: Rogene Houston, MD;  Location: [facility];  Service: Endoscopy;  Laterality: N/A;  . HAND SURGERY Right    broken hand  . POLYPECTOMY  08/10/2014   Procedure: POLYPECTOMY;  Surgeon: Rogene Houston, MD;  Location: [facility];  Service: Endoscopy;;  . WRIST SURGERY Right    fractured hand and wrist.       Home Medications    Prior to Admission medications   Medication Sig Start Date End Date Taking? Authorizing Provider    acetaminophen (TYLENOL) 500 MG tablet Take 1,000 mg by mouth every 6 (six) hours as needed for headache.   Yes Historical Provider, MD  diclofenac (VOLTAREN) 50 MG EC tablet Take 1 tablet (50 mg total) by mouth 2 (two) times daily. 08/24/15   Fransico Meadow, PA-C  fluticasone [facility]) 50 MCG/ACT nasal spray Place 2 sprays into both nostrils daily. 07/23/16   Julianne Rice, MD  ibuprofen (ADVIL,MOTRIN) 600 MG tablet Take 1 tablet (600 mg total) by mouth every 6 (six) hours as needed. 07/23/16   Julianne Rice, MD  loratadine (CLARITIN) 10 MG tablet Take 1 tablet (10 mg total) by mouth daily. 07/23/16   Julianne Rice, MD  methocarbamol (ROBAXIN) 500 MG tablet Take 1 tablet (500 mg total) by mouth every 8 (eight) hours as needed for muscle spasms. 07/23/16   Julianne Rice, MD    Family History Family History  Problem Relation Age of Onset  . Cancer Mother   . Cancer Other   . Heart failure Other     Social History Social History  Substance Use Topics  . Smoking status: Current Every Day Smoker    Packs/day: 1.00    Years: 27.00    Types: Cigarettes  . Smokeless tobacco: Never Used  . Alcohol use No     Allergies   Nickel   Review of Systems Review of Systems  Constitutional: Negative for chills and fever.  HENT: Positive for congestion. Negative for sore throat.  Eyes: Negative for photophobia and visual disturbance.  Respiratory: Negative for cough and shortness of breath.   Cardiovascular: Negative for chest pain.  Gastrointestinal: Negative for abdominal pain, diarrhea, nausea and vomiting.  Genitourinary: Negative for difficulty urinating, flank pain and frequency.  Musculoskeletal: Positive for back pain and myalgias. Negative for gait problem, neck pain and neck stiffness.  Skin: Negative for rash and wound.  Neurological: Positive for headaches. Negative for dizziness, weakness, light-headedness and numbness.  All other systems reviewed and are  negative.    Physical Exam Updated Vital Signs BP 113/83 (BP Location: Left Arm)   Pulse 83   Temp 98.1 F (36.7 C) (Oral)   Resp 16   Ht 5\' 10"  (1.778 m)   Wt 205 lb (93 kg)   SpO2 100%   BMI 29.41 kg/m   Physical Exam  Constitutional: He is oriented to person, place, and time. He appears well-developed and well-nourished. No distress.  HENT:  Head: Normocephalic and atraumatic.  Mouth/Throat: Oropharynx is clear and moist. No oropharyngeal exudate.  Bilateral nasal mucosal edema. No definite sinus tenderness with percussion. No obvious head injury.  Eyes: EOM are normal. Pupils are equal, round, and reactive to light.  No photophobia.  Neck: Normal range of motion. Neck supple.  No meningismus. No posterior midline cervical tenderness to palpation.  Cardiovascular: Normal rate and regular rhythm.  Exam reveals no gallop.   No murmur heard. Pulmonary/Chest: Effort normal and breath sounds normal. No respiratory distress. He has no wheezes. He has no rales. He exhibits no tenderness.  Abdominal: Soft. Bowel sounds are normal. There is no tenderness. There is no rebound and no guarding.  Musculoskeletal: Normal range of motion. He exhibits tenderness. He exhibits no edema.  Patient has midline and para paraspinal anterior lumbar tenderness to palpation. There are no step-offs or deformities. No CVA tenderness. No lower extremity swelling, asymmetry or tenderness.  Neurological: He is alert and oriented to person, place, and time.  Patient is alert and oriented x3 with clear, goal oriented speech. Patient has 5/5 motor in all extremities. Sensation is intact to light touch. No saddle anesthesia. Patient has a normal gait and walks without assistance.  Skin: Skin is warm and dry. Capillary refill takes less than 2 seconds. No rash noted. No erythema.  Psychiatric: His behavior is normal.  Patient is anxious appearing  Nursing note and vitals reviewed.    ED Treatments / Results   Labs (all labs ordered are listed, but only abnormal results are displayed) Labs Reviewed - No data to display  EKG  EKG Interpretation None       Radiology No results found.  Procedures Procedures (including critical care time)  Medications Ordered in ED Medications  ketorolac (TORADOL) injection 60 mg (not administered)     Initial Impression / Assessment and Plan / ED Course  I have reviewed the triage vital signs and the nursing notes.  Pertinent labs & imaging results that were available during my care of the patient were reviewed by me and considered in my medical decision making (see chart for details).     Patient presents with chronic headache and low back pain. Normal neurologic exam. Vital signs are stable. Do not believe that emergent imaging is necessary at this point. Given nasal congestion treat for possible sinus congestion. Otherwise, patient is advised to follow-up with a neurologist for his chronic headaches. I did also advise follow-up with neurosurgery for chronic low back pain. No red flag signs or  symptoms currently. I have also given patient resource list for pain management. Return precautions have been given.  Final Clinical Impressions(s) / ED Diagnoses   Final diagnoses:  Chronic bilateral low back pain with bilateral sciatica  Chronic intractable headache, unspecified headache type    New Prescriptions New Prescriptions   FLUTICASONE (FLONASE) 50 MCG/ACT NASAL SPRAY    Place 2 sprays into both nostrils daily.   IBUPROFEN (ADVIL,MOTRIN) 600 MG TABLET    Take 1 tablet (600 mg total) by mouth every 6 (six) hours as needed.   LORATADINE (CLARITIN) 10 MG TABLET    Take 1 tablet (10 mg total) by mouth daily.   METHOCARBAMOL (ROBAXIN) 500 MG TABLET    Take 1 tablet (500 mg total) by mouth every 8 (eight) hours as needed for muscle spasms.     Julianne Rice, MD 07/23/16 [phone]

## 2019-08-10 ENCOUNTER — Encounter (INDEPENDENT_AMBULATORY_CARE_PROVIDER_SITE_OTHER): Payer: Self-pay | Admitting: *Deleted
